# Patient Record
Sex: Male | Born: 1985 | Race: Black or African American | Hispanic: No | Marital: Single | Smoking: Current some day smoker
Health system: Southern US, Community
[De-identification: ages and names within clinical notes are randomized; demographics above are authoritative.]

## PROBLEM LIST (undated history)

## (undated) DIAGNOSIS — T7840XA Allergy, unspecified, initial encounter: Secondary | ICD-10-CM

## (undated) DIAGNOSIS — K219 Gastro-esophageal reflux disease without esophagitis: Secondary | ICD-10-CM

## (undated) DIAGNOSIS — I10 Essential (primary) hypertension: Secondary | ICD-10-CM

## (undated) HISTORY — DX: Allergy, unspecified, initial encounter: T78.40XA

## (undated) HISTORY — DX: Essential (primary) hypertension: I10

---
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
## 2007-04-22 ENCOUNTER — Ambulatory Visit: Payer: Self-pay | Admitting: Hospitalist

## 2007-04-22 DIAGNOSIS — I1A0 Resistant hypertension: Secondary | ICD-10-CM | POA: Insufficient documentation

## 2007-04-22 DIAGNOSIS — I10 Essential (primary) hypertension: Secondary | ICD-10-CM

## 2007-04-22 DIAGNOSIS — J309 Allergic rhinitis, unspecified: Secondary | ICD-10-CM | POA: Insufficient documentation

## 2007-04-29 ENCOUNTER — Emergency Department (HOSPITAL_COMMUNITY): Admission: EM | Admit: 2007-04-29 | Discharge: 2007-04-29 | Payer: Self-pay | Admitting: Emergency Medicine

## 2007-06-24 ENCOUNTER — Ambulatory Visit: Payer: Self-pay | Admitting: *Deleted

## 2007-11-08 ENCOUNTER — Telehealth (INDEPENDENT_AMBULATORY_CARE_PROVIDER_SITE_OTHER): Payer: Self-pay | Admitting: Internal Medicine

## 2007-11-13 ENCOUNTER — Encounter: Payer: Self-pay | Admitting: Internal Medicine

## 2007-11-18 ENCOUNTER — Encounter: Payer: Self-pay | Admitting: Internal Medicine

## 2008-01-08 ENCOUNTER — Telehealth: Payer: Self-pay | Admitting: Internal Medicine

## 2008-01-23 DIAGNOSIS — Z6841 Body Mass Index (BMI) 40.0 and over, adult: Secondary | ICD-10-CM

## 2008-03-04 ENCOUNTER — Encounter: Payer: Self-pay | Admitting: Internal Medicine

## 2008-03-05 ENCOUNTER — Encounter: Payer: Self-pay | Admitting: Internal Medicine

## 2008-03-05 ENCOUNTER — Ambulatory Visit: Payer: Self-pay | Admitting: *Deleted

## 2008-03-05 DIAGNOSIS — G4733 Obstructive sleep apnea (adult) (pediatric): Secondary | ICD-10-CM | POA: Insufficient documentation

## 2008-03-05 DIAGNOSIS — G4730 Sleep apnea, unspecified: Secondary | ICD-10-CM | POA: Insufficient documentation

## 2008-03-06 DIAGNOSIS — E559 Vitamin D deficiency, unspecified: Secondary | ICD-10-CM | POA: Insufficient documentation

## 2008-03-06 LAB — CONVERTED CEMR LAB
ALT: 57 units/L — ABNORMAL HIGH (ref 0–53)
AST: 28 units/L (ref 0–37)
Albumin: 4.4 g/dL (ref 3.5–5.2)
Alkaline Phosphatase: 48 units/L (ref 39–117)
BUN: 12 mg/dL (ref 6–23)
CO2: 23 meq/L (ref 19–32)
Calcium: 9.3 mg/dL (ref 8.4–10.5)
Chloride: 106 meq/L (ref 96–112)
Cholesterol: 156 mg/dL (ref 0–200)
Creatinine, Ser: 0.85 mg/dL (ref 0.40–1.50)
Glucose, Bld: 90 mg/dL (ref 70–99)
HDL: 41 mg/dL (ref >39)
LDL Cholesterol: 102 mg/dL — ABNORMAL HIGH (ref 0–99)
Potassium: 3.9 meq/L (ref 3.5–5.3)
Sodium: 141 meq/L (ref 135–145)
TSH: 1.776 microintl units/mL (ref 0.350–4.50)
Total Bilirubin: 0.6 mg/dL (ref 0.3–1.2)
Total CHOL/HDL Ratio: 3.8
Total Protein: 7 g/dL (ref 6.0–8.3)
Triglycerides: 63 mg/dL (ref <150)
VLDL: 13 mg/dL (ref 0–40)
Vit D, 25-Hydroxy: 17 ng/mL — ABNORMAL LOW (ref 30–89)

## 2008-05-29 ENCOUNTER — Encounter: Payer: Self-pay | Admitting: Internal Medicine

## 2008-05-29 DIAGNOSIS — M67919 Unspecified disorder of synovium and tendon, unspecified shoulder: Secondary | ICD-10-CM | POA: Insufficient documentation

## 2008-05-29 DIAGNOSIS — M719 Bursopathy, unspecified: Secondary | ICD-10-CM

## 2008-09-03 ENCOUNTER — Telehealth: Payer: Self-pay | Admitting: Internal Medicine

## 2008-12-16 ENCOUNTER — Ambulatory Visit: Payer: Self-pay | Admitting: Internal Medicine

## 2008-12-16 LAB — CONVERTED CEMR LAB
Basophils Absolute: 0 10*3/uL (ref 0.0–0.1)
Basophils Relative: 0 % (ref 0–1)
Eosinophils Absolute: 0.1 10*3/uL (ref 0.0–0.7)
Eosinophils Relative: 1 % (ref 0–5)
HCT: 42.2 % (ref 39.0–52.0)
Hemoglobin: 14.4 g/dL (ref 13.0–17.0)
Lymphocytes Relative: 31 % (ref 12–46)
Lymphs Abs: 3.9 10*3/uL (ref 0.7–4.0)
MCHC: 34.1 g/dL (ref 30.0–36.0)
MCV: 78.3 fL (ref >=78.0)
Monocytes Absolute: 1.1 10*3/uL — ABNORMAL HIGH (ref 0.1–1.0)
Monocytes Relative: 9 % (ref 3–12)
Neutro Abs: 7.4 10*3/uL (ref 1.7–7.7)
Neutrophils Relative %: 59 % (ref 43–77)
Platelets: 305 10*3/uL (ref 150–400)
RBC: 5.39 M/uL (ref 4.22–5.81)
RDW: 13.1 % (ref 11.5–15.5)
WBC: 12.5 10*3/uL — ABNORMAL HIGH (ref 4.0–10.5)

## 2009-03-01 ENCOUNTER — Telehealth: Payer: Self-pay | Admitting: Internal Medicine

## 2009-03-30 ENCOUNTER — Emergency Department (HOSPITAL_COMMUNITY): Admission: EM | Admit: 2009-03-30 | Discharge: 2009-03-30 | Payer: Self-pay | Admitting: Emergency Medicine

## 2009-03-31 ENCOUNTER — Emergency Department (HOSPITAL_COMMUNITY): Admission: EM | Admit: 2009-03-31 | Discharge: 2009-04-01 | Payer: Self-pay | Admitting: Emergency Medicine

## 2009-04-01 ENCOUNTER — Inpatient Hospital Stay (HOSPITAL_COMMUNITY): Admission: AD | Admit: 2009-04-01 | Discharge: 2009-04-02 | Payer: Self-pay | Admitting: Psychiatry

## 2009-04-01 ENCOUNTER — Ambulatory Visit: Payer: Self-pay | Admitting: Psychiatry

## 2009-04-08 ENCOUNTER — Ambulatory Visit: Payer: Self-pay | Admitting: Internal Medicine

## 2009-04-08 DIAGNOSIS — F988 Other specified behavioral and emotional disorders with onset usually occurring in childhood and adolescence: Secondary | ICD-10-CM

## 2009-04-08 DIAGNOSIS — F1999 Other psychoactive substance use, unspecified with unspecified psychoactive substance-induced disorder: Secondary | ICD-10-CM | POA: Insufficient documentation

## 2009-04-11 ENCOUNTER — Emergency Department (HOSPITAL_COMMUNITY): Admission: EM | Admit: 2009-04-11 | Discharge: 2009-04-11 | Payer: Self-pay | Admitting: Family Medicine

## 2009-05-05 ENCOUNTER — Telehealth: Payer: Self-pay | Admitting: Internal Medicine

## 2009-05-10 ENCOUNTER — Ambulatory Visit (HOSPITAL_COMMUNITY): Admission: RE | Admit: 2009-05-10 | Discharge: 2009-05-10 | Payer: Self-pay | Admitting: Internal Medicine

## 2009-05-10 ENCOUNTER — Ambulatory Visit: Payer: Self-pay | Admitting: Internal Medicine

## 2009-05-10 ENCOUNTER — Encounter: Payer: Self-pay | Admitting: Internal Medicine

## 2009-05-10 ENCOUNTER — Telehealth: Payer: Self-pay | Admitting: Internal Medicine

## 2009-05-10 DIAGNOSIS — M25562 Pain in left knee: Secondary | ICD-10-CM | POA: Insufficient documentation

## 2009-05-10 DIAGNOSIS — F418 Other specified anxiety disorders: Secondary | ICD-10-CM | POA: Insufficient documentation

## 2009-05-10 DIAGNOSIS — R300 Dysuria: Secondary | ICD-10-CM | POA: Insufficient documentation

## 2009-05-10 DIAGNOSIS — M25561 Pain in right knee: Secondary | ICD-10-CM | POA: Insufficient documentation

## 2009-05-10 LAB — CONVERTED CEMR LAB
Albumin: 4.6 g/dL (ref 3.5–5.2)
Alkaline Phosphatase: 42 units/L (ref 39–117)
BUN: 8 mg/dL (ref 6–23)
Barbiturate Quant, Ur: NEGATIVE
Blood in Urine, dipstick: NEGATIVE
CO2: 23 meq/L (ref 19–32)
Chlamydia, Swab/Urine, PCR: NEGATIVE
Cocaine Metabolites: NEGATIVE
Creatinine,U: 113.8 mg/dL
GC Probe Amp, Urine: NEGATIVE
Glucose, Bld: 100 mg/dL — ABNORMAL HIGH (ref 70–99)
Glucose, Urine, Semiquant: NEGATIVE
Hemoglobin, Urine: NEGATIVE
Hemoglobin: 14 g/dL (ref 13.0–17.0)
Ketones, ur: NEGATIVE mg/dL
Ketones, urine, test strip: NEGATIVE
Leukocytes, UA: NEGATIVE
MCHC: 31.7 g/dL (ref 30.0–36.0)
MCV: 83.4 fL (ref >=78.0)
Methadone: NEGATIVE
Nitrite: NEGATIVE
Nitrite: NEGATIVE
Opiates: NEGATIVE
Potassium: 4.1 meq/L (ref 3.5–5.3)
Propoxyphene: NEGATIVE
Protein, ur: NEGATIVE mg/dL
RBC: 5.29 M/uL (ref 4.22–5.81)
Sodium: 137 meq/L (ref 135–145)
Total Protein: 7 g/dL (ref 6.0–8.3)
Urobilinogen, UA: 0.2
Urobilinogen, UA: 0.2 (ref 0.0–1.0)
WBC: 8.9 10*3/uL (ref 4.0–10.5)
pH: 5

## 2009-05-11 ENCOUNTER — Encounter: Payer: Self-pay | Admitting: Internal Medicine

## 2009-05-11 ENCOUNTER — Telehealth: Payer: Self-pay | Admitting: Internal Medicine

## 2009-06-15 ENCOUNTER — Encounter: Payer: Self-pay | Admitting: Internal Medicine

## 2009-07-02 ENCOUNTER — Encounter: Payer: Self-pay | Admitting: Internal Medicine

## 2009-07-05 ENCOUNTER — Encounter: Payer: Self-pay | Admitting: Internal Medicine

## 2009-07-05 ENCOUNTER — Ambulatory Visit: Payer: Self-pay | Admitting: Internal Medicine

## 2009-07-06 LAB — CONVERTED CEMR LAB
ALT: 54 units/L — ABNORMAL HIGH (ref 0–53)
Albumin: 4.4 g/dL (ref 3.5–5.2)
Alkaline Phosphatase: 42 units/L (ref 39–117)
Basophils Relative: 0 % (ref 0–1)
CO2: 23 meq/L (ref 19–32)
Creatinine, Ser: 1.02 mg/dL (ref 0.40–1.50)
Eosinophils Absolute: 0 10*3/uL (ref 0.0–0.7)
Eosinophils Relative: 1 % (ref 0–5)
HCT: 44.2 % (ref 39.0–52.0)
HDL: 48 mg/dL (ref >39)
Hemoglobin: 14.7 g/dL (ref 13.0–17.0)
LDL Cholesterol: 121 mg/dL — ABNORMAL HIGH (ref 0–99)
Lymphs Abs: 3.3 10*3/uL (ref 0.7–4.0)
MCHC: 33.3 g/dL (ref 30.0–36.0)
MCV: 80.4 fL (ref >=78.0)
Monocytes Absolute: 0.6 10*3/uL (ref 0.1–1.0)
Monocytes Relative: 8 % (ref 3–12)
Neutrophils Relative %: 48 % (ref 43–77)
Platelets: 287 10*3/uL (ref 150–400)
Sodium: 140 meq/L (ref 135–145)
Total Bilirubin: 0.5 mg/dL (ref 0.3–1.2)
Total CHOL/HDL Ratio: 3.8
Total Protein: 6.8 g/dL (ref 6.0–8.3)
WBC: 7.8 10*3/uL (ref 4.0–10.5)

## 2009-11-17 ENCOUNTER — Encounter: Payer: Self-pay | Admitting: Internal Medicine

## 2009-12-08 ENCOUNTER — Ambulatory Visit (HOSPITAL_COMMUNITY): Admission: RE | Admit: 2009-12-08 | Discharge: 2009-12-08 | Payer: Self-pay | Admitting: Internal Medicine

## 2009-12-08 ENCOUNTER — Ambulatory Visit: Payer: Self-pay | Admitting: Internal Medicine

## 2010-02-15 NOTE — Progress Notes (Signed)
Summary: Urinating  Phone Note Call from Patient   Caller: Mom Call For: Julaine Fusi  DO Summary of Call: Call from pt's mom spoke to telecommunicator.  Said that pt is having problems urinating. Pt was given an appointment for this am.Gladys Herbin RN  May 10, 2009 9:31 AM  Initial call taken by: Angelina Ok RN,  May 10, 2009 9:31 AM  Follow-up for Phone Call        spoke with patient- hx of epididymitis untreated- was suppose to have taken Doxy (seen at Urgent care). Will put in for UA, urine GC probe, and blood work- he was recentkly started on Abilify and Lamictal by psychiatrist. To be seen in clinic today. Follow-up by: Julaine Fusi  DO,  May 10, 2009 9:55 AM  New Problems: DYSURIA (ICD-788.1) DEPRESSION/ANXIETY (ICD-300.4)   New Problems: DYSURIA (ICD-788.1) DEPRESSION/ANXIETY (ICD-300.4)  Process Orders Check Orders Results:     [facility]: ABN not required for this insurance Tests Sent for requisitioning (May 10, 2009 1:41 PM):     05/10/2009: [facility] -- T-Chlamydia & GC Probe, Urine [[id number]] (signed)     05/10/2009: [facility] -- T-Drug Screen-Urine, (single) [[id number]] (signed)

## 2010-02-15 NOTE — Progress Notes (Signed)
  Phone Note Call from Patient   Caller: Mom Call For: Dakota Fusi  DO Reason for Call: Lab or Test Results Details for Reason: Orders Summary of Call: PLease remember to put orders in for this patient.  Labs orders were faxed from Dr. Tresa Endo Virgil's office and have been placed in your box. After labs are drawn and reviewed please fax them to [phone].  Per Patient's request. Thanks!! Initial call taken by: Shon Hough,  May 05, 2009 2:55 PM  Follow-up for Phone Call        done Follow-up by: Dakota Fusi  DO,  May 16, 2009 4:31 PM

## 2010-02-15 NOTE — Assessment & Plan Note (Signed)
Summary: [name]   Vital Signs:  Patient profile:   25 year old male Height:      73.5 inches (186.69 cm) Weight:      321.7 pounds (146.23 kg) BMI:     42.02 Temp:     98.5 degrees F (36.94 degrees C) oral Pulse rate:   87 / minute BP sitting:   150 / 91  (right arm)  Vitals Entered By: Dakota Kidney Ditzler RN (May 10, 2009 10:09 AM) Is Patient Diabetic? No Pain Assessment Patient in pain? no      Nutritional Status BMI of > 30 = obese Nutritional Status Detail appetite good  Have you ever been in a relationship where you felt threatened, hurt or afraid?denies   Does patient need assistance? Functional Status Self care Ambulation Normal Comments Mother with pt. Discuss side effects from new drugs from [facility]. Needs labs. Problems with urination and BM. Fell on left knee about 1 month ago - sore, tender and popping sounds.   Primary Care Provider:  Julaine Fusi  DO   History of Present Illness: 30 you pleasant male with PMH outlined below presents to clinic for regular follow up concerned with side effects of the meds he is on. He tells me that he thinks that Lamictal andAbilify are causing him to have restlessness and iritability, he is unable to get comfortable and his personal thinking is that the meds are not helping him and he would like to start thinking about slowly tapering it off. Overall he also tells me that he thinks there is a significant improvement in his sleeping habits and depression and he is able to focus more. He gets about 5 hours of sleep at night and has not had any nightmares or night terrors.  He reports left knee pain, he fell one week ago and still has intermittent knee pain. Improving but still present.   Depression History:      The patient denies a depressed mood most of the day and a diminished interest in his usual daily activities.  Positive alarm features for depression include insomnia and impaired  concentration (indecisiveness).  However, he denies significant weight loss, significant weight gain, hypersomnia, psychomotor agitation, psychomotor retardation, fatigue (loss of energy), feelings of worthlessness (guilt), and recurrent thoughts of death or suicide.        The patient denies that he feels like life is not worth living, denies that he wishes that he were dead, and denies that he has thought about ending his life.         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 Take 1 Tablet By Mouth Once A Day 2)  Drisdol 16109 Unit Caps (Ergocalciferol) .... Take One Tablet By Mouth Once Weekly or As Directed. 3)  Ibuprofen  600 Mg Tabs (Ibuprofen) .... Take 1 Tablet By Mouth Three Times A Day As Needed For Pain 4)  Nexium 20 Mg Cpdr (Esomeprazole Magnesium) .... Take 1 Tablet By Mouth Once A Day 5)  Avelox 400 Mg Tabs (Moxifloxacin Hcl) .... Take 1 Tablet By Mouth Daily. 6)  Proair Hfa 108 (90 Base) Mcg/act Aers (Albuterol Sulfate) .... Inhale 2 Puff Every 6 Hour As Needed. 7)  Flonase 50 Mcg/act Susp (Fluticasone Propionate) .... Instill 2 Sprays Per Nostril Once Daily. 8)  Guaifenesin 200 Mg Tabs (Guaifenesin) .... Take 1 Tablet Every 6 Hour For Cough As Needed. 9)  Vyvanse 40 Mg Caps (Lisdexamfetamine Dimesylate) ....  Take 1 Tablet By Mouth Once A Day 10)  Trazodone Hcl 150 Mg Tabs (Trazodone Hcl) .... Take 1 Tablet By Mouth Once A Day At Bedtime  Current Medications (verified): 1)  Benicar 40 Mg  Tabs (Olmesartan Medoxomil) .... Take 1 Tablet By Mouth Once A Day 2)  Drisdol 66440 Unit Caps (Ergocalciferol) .... Take One Tablet By Mouth Once Weekly or As Directed. 3)  Ibuprofen 600 Mg Tabs (Ibuprofen) .... Take 1 Tablet By Mouth Three Times A Day As Needed For Pain 4)  Nexium 20 Mg Cpdr (Esomeprazole Magnesium) .... Take 1 Tablet By Mouth Once A Day 5)  Avelox 400 Mg Tabs (Moxifloxacin Hcl) .... Take 1 Tablet By Mouth Daily. 6)  Proair Hfa 108 (90 Base) Mcg/act Aers (Albuterol Sulfate) .... Inhale 2 Puff Every 6 Hour As Needed. 7)  Flonase 50 Mcg/act Susp (Fluticasone Propionate) .... Instill 2 Sprays Per Nostril Once Daily. 8)  Guaifenesin 200 Mg Tabs (Guaifenesin) .... Take 1 Tablet Every 6 Hour For Cough As Needed. 9)  Vyvanse 40 Mg Caps (Lisdexamfetamine Dimesylate) .... Take 1 Tablet By Mouth Once A Day 10)  Trazodone Hcl 150 Mg Tabs (Trazodone Hcl) .... Take 1 Tablet By Mouth Once A Day At Bedtime  Allergies (verified): No Known Drug Allergies  Past History:  Past Medical History: Last updated: 04/22/2007 Allergic rhinitis Hypertension  Social History: Last updated: 04/08/2009 [facility] [employer] No smoking Occasional ETOH  Risk Factors: Exercise: yes (05/10/2009)  Risk Factors: Smoking Status: quit (05/10/2009)  Social History: Reviewed history from 04/08/2009 and no changes required. [employer] Student Voice Major No smoking Occasional ETOH  Review of Systems       per HPI  Physical Exam  General:  Well-developed,well-nourished,in no acute distress; alert,appropriate and cooperative throughout examination Lungs:  Normal respiratory effort, chest expands symmetrically. Lungs are clear to auscultation, no crackles or wheezes. Heart:  Normal rate  and regular rhythm. S1 and S2 normal without gallop, murmur, click, rub or other extra sounds. Psych:  Oriented X3, memory intact for recent and remote, normally interactive, good eye contact, not anxious appearing, not agitated, not suicidal, and not homicidal.     Impression & Recommendations:  Problem # 1:  HYPERTENSION, ESSENTIAL NOS (ICD-401.9) Somewhat higher today but will not change the regimen at this time. Will continue to monitor closely and will readjust the regimen as indicated.  His updated medication list for this problem includes:    Benicar 40 Mg Tabs (Olmesartan medoxomil) .Marland Kitchen... Take 1 tablet by mouth once a day  BP today: 150/91 Prior BP: 139/84 (04/08/2009)  Labs Reviewed: K+: 3.9 (03/05/2008) Creat: : 0.85 (03/05/2008)   Chol: 156 (03/05/2008)   HDL: 41 (03/05/2008)   LDL: 102 (03/05/2008)   TG: 63 (03/05/2008)  Problem # 2:  KNEE PAIN, LEFT, ACUTE (ICD-719.46) Likely  trauma induced (from fall on his patella), and since it is in healing process will check xray amd kae sure no acute pathologies, otherwise will cont to follow up and will give ibuprofen as needed.  His updated medication list for this problem includes:    Ibuprofen 600 Mg Tabs (Ibuprofen) .Marland Kitchen... 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 What he is describing could be side effect of both of the meds but will have him follow up with [facility] for medication adjustment. My personal thinking is that he needs to start exercising and work on the diet changes regimen. I have discussed this with him in detail and have given him 30 day pass at the gym. He has agreed to make this a daily habit for 30 min to 1 hour and he ahs agreed with me that he will start reading any material at least 2 pages per day and  will work up from there. He is confident that he can do these things and will follow up on this on his next appointment.  Orders: T-Comprehensive Metabolic Panel ([id number])  Problem # 4:  DYSURIA (ICD-788.1) Will check UA today and advised him to complete Doxycycline treatment for epididymitis. The following medications were removed from the medication list:    Avelox 400 Mg Tabs (Moxifloxacin hcl) .Marland Kitchen... Take 1 tablet by mouth daily.  Orders: T-Urinalysis ([id number])  Complete Medication List: 1)  Benicar 40 Mg Tabs (Olmesartan medoxomil) .... Take 1 tablet by mouth once a day 2)  Drisdol 29562 Unit Caps (Ergocalciferol) .... Take one tablet by mouth once weekly or as directed. 3)  Ibuprofen 600 Mg Tabs (Ibuprofen) .... Take 1 tablet by mouth three times a day as needed for pain 4)  Nexium 20 Mg Cpdr (Esomeprazole magnesium) .... Take 1 tablet by mouth once a day 5)  Proair Hfa 108 (90 Base) Mcg/act Aers (Albuterol sulfate) .... Inhale 2 puff every 6 hour as needed. 6)  Flonase 50 Mcg/act Susp (Fluticasone propionate) .... Instill 2 sprays per nostril once daily. 7)  Guaifenesin 200 Mg Tabs (Guaifenesin) .... Take 1 tablet every 6 hour for cough as needed. 8)  Vyvanse 40 Mg Caps (Lisdexamfetamine dimesylate) .... Take 1 tablet by mouth once a day 9)  Trazodone Hcl 150 Mg Tabs (Trazodone hcl) .... Take 1 tablet by mouth once a day at bedtime  Other Orders: T-CBC No Diff ([id number])  Patient Instructions: 1)  Please schedule a follow-up appointment in 3 months. 2)  Good job on your diet and exercise changes.  3)  Keep it up!!! 4)  Try to read before you go to sleep. Prescriptions: IBUPROFEN 600 MG TABS (IBUPROFEN) Take 1 tablet by mouth three times a day as needed for pain  #90 x 3   Entered and Authorized by:   Mliss Sax MD   Signed by:   Mliss Sax MD on 05/10/2009   Method used:   Electronically to        [facility] (retail)       [address]        [place], [place]  [address]       Ph: [phone]       Fax: [phone]   RxID:   [id number]  Process Orders Check Orders Results:     [facility]: ABN not required for this insurance Tests Sent for requisitioning (May 10, 2009 2:48 PM):     05/10/2009: [facility] -- T-Comprehensive Metabolic Panel [[id number]] (signed)     05/10/2009: [facility] -- T-CBC No Diff [[id number]] (signed)     05/10/2009: [facility] -- T-Urinalysis [[id number]] (signed)    Prevention & Chronic Care Immunizations   Influenza vaccine: Not documented   Influenza vaccine deferral: Not indicated  (05/10/2009)    Tetanus booster: Not documented   Td booster deferral: Not indicated  (05/10/2009)    Pneumococcal vaccine: Not documented  Other Screening   Smoking status: quit  (05/10/2009)  Hypertension   Last Blood Pressure: 150 / 91  (05/10/2009)   Serum creatinine: 0.85  (03/05/2008)   Serum potassium 3.9  (03/05/2008) CMP ordered     Hypertension flowsheet reviewed?: Yes   Progress toward BP goal: Deteriorated  Self-Management Support :   Personal Goals (by the next clinic visit) :      Personal blood pressure goal: 140/90  (04/08/2009)   Hypertension self-management support: Written self-care plan, Education handout, Resources for patients handout  (05/10/2009)   Hypertension self-care plan printed.   Hypertension education handout printed      Resource handout printed.   Laboratory Results   Urine Tests  Date/Time Received: 05/10/09 10:28AM Date/Time Reported: same  Routine Urinalysis   Color: yellow Appearance: Clear Glucose: negative   (Normal Range: Negative) Bilirubin: negative   (Normal Range: Negative) Ketone: negative   (Normal Range: Negative) Spec. Gravity: 1.015   (Normal Range: 1.003-1.035) Blood: negative   (Normal Range: Negative) pH: 5.0   (Normal Range:  5.0-8.0) Protein: negative   (Normal Range: Negative) Urobilinogen: 0.2   (Normal Range: 0-1) Nitrite: negative   (Normal Range: Negative) Leukocyte Esterace: negative   (Normal Range: Negative)

## 2010-02-15 NOTE — Miscellaneous (Signed)
Summary: Orders Update  Clinical Lists Changes  Problems: Added new problem of ENCOUNTER FOR LONG-TERM USE OF OTHER MEDICATIONS (ICD-V58.69) Orders: Added new Test order of T-Lipid Profile [id number]) - Signed Added new Test order of T-CBC w/Diff [id number]) - Signed Added new Test order of T-CMP with Estimated GFR ([id number]) - Signed     Process Orders Check Orders Results:     [facility]: ABN not required for this insurance Tests Sent for requisitioning (July 03, 2009 12:42 PM):     07/02/2009: [facility] -- T-Lipid Profile [id number] (signed)     07/02/2009: [facility] -- T-CBC w/Diff [[id number]] (signed)     07/02/2009: [facility] -- T-CMP with Estimated GFR [[id number]] (signed)

## 2010-02-15 NOTE — Assessment & Plan Note (Signed)
Summary: EKG ONLY NOT MD VISIT/DS   Vital Signs:  Patient profile:   25 year old male Height:      73.5 inches (186.69 cm) Weight:      355.7 pounds (161.68 kg) Temp:     97.6 degrees F oral Pulse rate:   57 / minute BP sitting:   145 / 89  (left arm) Cuff size:   large  Vitals Entered By: Cynda Familia Duncan Dull) (December 08, 2009 1:28 PM) CC: Pt here for EKG only to be faxed to [facility] atten: Saul Fordyce fax# [phone] Is Patient Diabetic? No Research Study Name: EKG was shown to Dr Coralee Pesa on Dr Lamar Blinks behalf.Cynda Familia [facility])  December 08, 2009 1:51 PM  Pain Assessment Patient in pain? no      Nutritional Status BMI of > 30 = obese  Have you ever been in a relationship where you felt threatened, hurt or afraid?No   Does patient need assistance? Functional Status Self care Ambulation Normal   CC:  Pt here for EKG only to be faxed to [facility] atten: Saul Fordyce fax# [phone].  Allergies: No Known Drug Allergies   Complete Medication List: 1)  Benicar 40 Mg Tabs (Olmesartan medoxomil) .... Take 1 tablet by mouth once a day 2)  Drisdol 60630 Unit Caps (Ergocalciferol) .... Take one tablet by mouth once weekly or as directed. 3)  Ibuprofen 600 Mg Tabs (Ibuprofen) .... Take 1 tablet by mouth three times a day as needed for pain 4)  Nexium 20 Mg Cpdr (Esomeprazole magnesium) .... Take 1 tablet by mouth once a day 5)  Proair Hfa 108 (90 Base) Mcg/act Aers (Albuterol sulfate) .... Inhale 2 puff every 6 hour as needed. 6)  Flonase 50 Mcg/act Susp (Fluticasone propionate) .... Instill 2 sprays per nostril once daily. 7)  Guaifenesin 200 Mg Tabs (Guaifenesin) .... Take 1 tablet every 6 hour for cough as needed. 8)  Vyvanse 40 Mg Caps (Lisdexamfetamine dimesylate) .... Take 1 tablet by mouth once a day 9)  Trazodone Hcl 150 Mg Tabs (Trazodone hcl) .... Take 1 tablet by mouth once a day at  bedtime  Other Orders: 12 Lead EKG (12 Lead EKG)   Orders Added: 1)  12 Lead EKG [12 Lead EKG]

## 2010-02-15 NOTE — Miscellaneous (Signed)
Summary: EKG order  Clinical Lists Changes Needs EKG done for Concerta prescribing. Will place order. Orders: Added new Test order of 12 Lead EKG (12 Lead EKG) - Signed

## 2010-02-15 NOTE — Progress Notes (Signed)
Summary: med refill/gp  Phone Note Refill Request Message from:  Fax from Pharmacy on March 01, 2009 3:37 PM  Refills Requested: Medication #1:  BENICAR 40 MG  TABS Take 1 tablet by mouth once a day   Last Refilled: 03/01/2009 Last appt. 12/16/08.   Method Requested: Electronic Initial call taken by: Chinita Pester RN,  March 01, 2009 3:37 PM  Follow-up for Phone Call        Refill approved-nurse to complete Follow-up by: Julaine Fusi  DO,  March 02, 2009 12:43 PM    Prescriptions: BENICAR 40 MG  TABS (OLMESARTAN MEDOXOMIL) Take 1 tablet by mouth once a day  #30 x 6   Entered and Authorized by:   Julaine Fusi  DO   Signed by:   Julaine Fusi  DO on 03/02/2009   Method used:   Electronically to        [facility] (retail)       [address]       [address] Shipping/mailing       [place], [place]  [address]       Ph: [phone]       Fax: [phone]   RxID:   [id number]

## 2010-02-15 NOTE — Assessment & Plan Note (Signed)
Summary: Dr. Phillips Odor to see pt. pls page her when pt. arrive [mkj]   Vital Signs:  Patient profile:   25 year old male Height:      73.5 inches (186.69 cm) Weight:      304.3 pounds (138.32 kg) BMI:     39.75 Temp:     98.9 degrees F (37.17 degrees C) oral Pulse rate:   77 / minute BP sitting:   139 / 84  (right arm) Cuff size:   large  Vitals Entered By: Krystal Eaton Duncan Dull) (April 08, 2009 11:00 AM) Nutritional Status BMI of > 30 = obese  Does patient need assistance? Functional Status Self care Ambulation Normal   Primary Care Provider:  Julaine Fusi  DO   History of Present Illness: Dakota Nicholson comes in today for evaluation after he was admitted to [facility] on 04/04/09 for an acute episode of brief psycosis following a binge on marajuana at college. Several hours after smoking a large amount of THC he began to hallucinate and become paranoid. He had a strong suspicion that it was "laced" with something unusual- possibly a synthetic THC called "Spice" that has been circulating throughout the college campuses in [place] according to local new channels. After using the drugs and ETOH. He called his mother and in a disoriented state "confessed all of his sins" to her. He told her that he had been using drugs and ETOH while in college on a regular basis. He has over the last semester had failing grades, has shown signs of depression, lack of interest in previously enjoyable activities and has lost a significant amonth of weight. He was d/c'd from [facility] without any medication other than Ambien for sleep. There is no d/c summary for review. Their recommendation was for counseling. No medications were started or given other than Ativan while he was in the ED.  Preventive Screening-Counseling & Management  Alcohol-Tobacco     Smoking Status: quit  Current Medications (verified): 1)  Benicar 40 Mg  Tabs (Olmesartan Medoxomil) .... Take 1 Tablet By Mouth Once A Day 2)  Drisdol 16109  Unit Caps (Ergocalciferol) .... Take One Tablet By Mouth Once Weekly or As Directed. 3)  Ibuprofen 600 Mg Tabs (Ibuprofen) .... Take 1 Tablet By Mouth Three Times A Day As Needed For Pain 4)  Nexium 20 Mg Cpdr (Esomeprazole Magnesium) .... Take 1 Tablet By Mouth Once A Day 5)  Avelox 400 Mg Tabs (Moxifloxacin Hcl) .... Take 1 Tablet By Mouth Daily. 6)  Proair Hfa 108 (90 Base) Mcg/act Aers (Albuterol Sulfate) .... Inhale 2 Puff Every 6 Hour As Needed. 7)  Flonase 50 Mcg/act Susp (Fluticasone Propionate) .... Instill 2 Sprays Per Nostril Once Daily. 8)  Guaifenesin 200 Mg Tabs (Guaifenesin) .... Take 1 Tablet Every 6 Hour For Cough As Needed. 9)  Vyvanse 40 Mg Caps (Lisdexamfetamine Dimesylate) .... Take 1 Tablet By Mouth Once A Day 10)  Trazodone Hcl 150 Mg Tabs (Trazodone Hcl) .... 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 Head:  normocephalic, atraumatic, and no abnormalities observed.   Eyes:  pupils equal, pupils round, and pupils reactive to light.   Neck:  supple.  Lungs:  normal respiratory effort, no intercostal retractions, no accessory muscle use, and normal breath sounds.   Heart:  normal rate, regular rhythm, and no murmur.   Abdomen:  soft, non-tender, normal bowel sounds, and no distention.   Neurologic:  alert & oriented X3 and strength normal in all extremities.   Psych:  Mild psychomotor delay. Long pauses before answering questions, but answers appropriately. Seems slightly paranoid. Some religiosity. No signs of mania. No AVH. No SI or HI. Looks apathetic and disinterested. Expresses deep feelings of guilt and remorse out of proportion to events.   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 underlying psychiatric pathology- I am assuming it is a combination of both. Dakota Nicholson is obviously depressed and there may be some element of depression with psychosis present. [facility] did not discharge him on any medication which is unusual if there was a serious underlying psychiatric problem. His presentation is very atypical for any one specific problem- I suspect he has "shut down" in the setting of all of the consequences of his poor decisions while at college. He has a very low self esteem in general.  He is failing his classes, he is fearful of what others will think of him at home and school, and he expresses extreme religious guilt and remore. I also think he is in a state of fear that he may get in trouble or be harmed from a legal standpoint, and is likely more involved with drugs and drug dealers than most of his providers and family realize.  I am hesitant to start him on an SSRI without the supervision and guidance of a psychiatrist. He has an appointment with Dr. Wynonia Lawman at [facility] next month. Dakota Nicholson is going to need extensive counseling and also family therapy. He has a very close family here in [place] and his parents are extremely involved in his day-to-day life and school. 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 I think it is very unusual that Dakota Nicholson does not drive a car at 25 years old. He does live in the dorms but has had close supervision and involvement of his parents  at [employer]. 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  Psychiatry appt. Counseling Appt scheduel for today/ Start Trazadone for sleep.  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 It is quite possible that the drug use, failing grades and anxiety/depression are a result of untreated ADHD. I will start him on treatment today for 1 month as a trial to see if his focus and energy level improve.  Complete Medication List: 1)  Benicar 40 Mg Tabs (Olmesartan medoxomil) .... Take 1 tablet by mouth once a day 2)  Drisdol 04540 Unit Caps (Ergocalciferol) .... Take one tablet by mouth once weekly or as directed. 3)  Ibuprofen 600 Mg Tabs (Ibuprofen) .... Take 1 tablet by mouth three times a day as needed for pain 4)  Nexium 20 Mg Cpdr (Esomeprazole magnesium) .... Take 1 tablet by mouth once a day 5)  Avelox 400 Mg Tabs (Moxifloxacin hcl) .... Take 1 tablet by mouth daily. 6)  Proair Hfa 108 (90 Base) Mcg/act Aers (Albuterol sulfate) .... Inhale 2  puff every 6 hour as needed. 7)  Flonase 50 Mcg/act Susp (Fluticasone propionate) .... Instill 2 sprays per nostril once daily. 8)  Guaifenesin 200 Mg Tabs (Guaifenesin) .... Take 1 tablet every 6 hour for cough as needed. 9)  Vyvanse 40 Mg Caps (Lisdexamfetamine dimesylate) .... Take 1 tablet by mouth once a day 10)  Trazodone Hcl 150 Mg Tabs (Trazodone hcl) .... Take 1 tablet by mouth once a day at bedtime  Patient Instructions: 1)  Will start Vyvanse every AM. 2)  Start Trazadone one tab by mouth at bedtime. 3)  Otherwise continue with plan to see therapist and psychiatry.  Prescriptions: VYVANSE 40 MG CAPS (LISDEXAMFETAMINE DIMESYLATE) Take 1 tablet by mouth once a day  #31 x 0   Entered and Authorized by:   Julaine Fusi  DO   Signed by:   Julaine Fusi  DO on 04/08/2009   Method used:   Print then Give to Patient   RxID:   [id number] TRAZODONE HCL 150 MG TABS (TRAZODONE HCL) Take 1 tablet by mouth once a day at bedtime  #31 x 0   Entered and Authorized by:   Julaine Fusi  DO   Signed by:   Julaine Fusi  DO on 04/08/2009   Method used:   Print then Give to Patient    RxID:   [id number] VYVANSE 40 MG CAPS (LISDEXAMFETAMINE DIMESYLATE) Take 1 tablet by mouth once a day  #60 x 0   Entered and Authorized by:   Julaine Fusi  DO   Signed by:   Julaine Fusi  DO on 04/08/2009   Method used:   Print then Give to Patient   RxID:   [id number]   Prevention & Chronic Care Immunizations   Influenza vaccine: Not documented    Tetanus booster: Not documented    Pneumococcal vaccine: Not documented  Other Screening   Smoking status: quit  (04/08/2009)  Hypertension   Last Blood Pressure: 139 / 84  (04/08/2009)   Serum creatinine: 0.85  (03/05/2008)   Serum potassium 3.9  (03/05/2008)  Self-Management Support :   Personal Goals (by the next clinic visit) :      Personal blood pressure goal: 140/90  (04/08/2009)   Patient will work on the following items until the next clinic visit to reach self-care goals:     Medications and monitoring: take my medicines every day  (04/08/2009)     Eating: eat more vegetables, eat foods that are low in salt, eat baked foods instead of fried foods  (04/08/2009)     Activity: take a 30 minute walk every day  (04/08/2009)    Hypertension self-management support: Pre-printed educational material, Education handout, Written self-care plan  (04/08/2009)   Hypertension self-care plan printed.   Hypertension education handout printed     Patient Instructions: 1)  Will start Vyvanse every AM. 2)  Start Trazadone one tab by mouth at bedtime. 3)  Otherwise continue with plan to see therapist and psychiatry.

## 2010-02-15 NOTE — Progress Notes (Signed)
Summary: refill/gg  Phone Note Refill Request  on May 11, 2009 9:41 AM  Refills Requested: Medication #1:  NEXIUM 20 MG CPDR Take 1 tablet by mouth once a day   Last Refilled: 03/22/2009  Method Requested: Electronic Initial call taken by: Merrie Roof RN,  May 11, 2009 9:42 AM  Follow-up for Phone Call        completed refill, thank you Breon Diss  Follow-up by: Mliss Sax MD,  May 11, 2009 11:02 AM    Prescriptions: NEXIUM 20 MG CPDR (ESOMEPRAZOLE MAGNESIUM) Take 1 tablet by mouth once a day  #30 x 3   Entered by:   Mliss Sax MD   Authorized by:   Julaine Fusi  DO   Signed by:   Mliss Sax MD on 05/11/2009   Method used:   Electronically to        [facility]* (retail)       [address]       [address] Shipping/mailing       [place], [place]  [address]       Ph: [phone]       Fax: [phone]   RxID:   [id number]

## 2010-02-15 NOTE — Miscellaneous (Signed)
Summary: [facility]   Imported By: Margie Billet 05/13/2009 11:24:42  _____________________________________________________________________  External Attachment:    Type:   Image     Comment:   External Document

## 2010-04-10 LAB — POCT I-STAT, CHEM 8
Calcium, Ion: 1.16 mmol/L (ref 1.12–1.32)
Glucose, Bld: 111 mg/dL — ABNORMAL HIGH (ref 70–99)
HCT: 45 % (ref 39.0–52.0)
Hemoglobin: 15.3 g/dL (ref 13.0–17.0)
Potassium: 3.8 mEq/L (ref 3.5–5.1)

## 2010-04-10 LAB — CBC
HCT: 43.2 % (ref 39.0–52.0)
Hemoglobin: 14.3 g/dL (ref 13.0–17.0)
MCV: 81.3 fL (ref 78.0–100.0)
Platelets: 273 10*3/uL (ref 150–400)
RBC: 5.32 MIL/uL (ref 4.22–5.81)
WBC: 14 10*3/uL — ABNORMAL HIGH (ref 4.0–10.5)

## 2010-04-10 LAB — ETHANOL
Alcohol, Ethyl (B): <5 mg/dL (ref 0–10)
Alcohol, Ethyl (B): <5 mg/dL (ref 0–10)

## 2010-04-10 LAB — COMPREHENSIVE METABOLIC PANEL
Albumin: 4.3 g/dL (ref 3.5–5.2)
Alkaline Phosphatase: 48 U/L (ref 39–117)
BUN: 7 mg/dL (ref 6–23)
CO2: 24 mEq/L (ref 19–32)
Chloride: 105 mEq/L (ref 96–112)
Creatinine, Ser: 0.86 mg/dL (ref 0.4–1.5)
GFR calc non Af Amer: >60 mL/min (ref >60)
Glucose, Bld: 88 mg/dL (ref 70–99)
Potassium: 4 mEq/L (ref 3.5–5.1)
Total Bilirubin: 0.4 mg/dL (ref 0.3–1.2)

## 2010-04-10 LAB — POCT URINALYSIS DIP (DEVICE)
Bilirubin Urine: NEGATIVE
Glucose, UA: NEGATIVE mg/dL
Hgb urine dipstick: NEGATIVE
Specific Gravity, Urine: >=1.03 (ref 1.005–1.030)
pH: 5.5 (ref 5.0–8.0)

## 2010-04-10 LAB — DIFFERENTIAL
Basophils Absolute: 0.1 10*3/uL (ref 0.0–0.1)
Basophils Relative: 1 % (ref 0–1)
Monocytes Absolute: 1.3 10*3/uL — ABNORMAL HIGH (ref 0.1–1.0)
Neutro Abs: 10.5 10*3/uL — ABNORMAL HIGH (ref 1.7–7.7)

## 2010-04-10 LAB — GLUCOSE, CAPILLARY: Glucose-Capillary: 198 mg/dL — ABNORMAL HIGH (ref 70–99)

## 2010-04-10 LAB — RAPID URINE DRUG SCREEN, HOSP PERFORMED: Barbiturates: NOT DETECTED

## 2010-06-02 ENCOUNTER — Other Ambulatory Visit: Payer: Self-pay | Admitting: *Deleted

## 2010-06-02 MED ORDER — ESOMEPRAZOLE MAGNESIUM 20 MG PO CPDR: 20.0000 mg | DELAYED_RELEASE_CAPSULE | Freq: Every day | ORAL | Status: DC

## 2010-06-02 MED ORDER — OLMESARTAN MEDOXOMIL 40 MG PO TABS: 40.0000 mg | ORAL_TABLET | Freq: Every day | ORAL | Status: DC

## 2010-06-14 ENCOUNTER — Encounter: Payer: Self-pay | Admitting: Internal Medicine

## 2010-08-22 ENCOUNTER — Ambulatory Visit (INDEPENDENT_AMBULATORY_CARE_PROVIDER_SITE_OTHER): Payer: Self-pay | Admitting: Internal Medicine

## 2010-08-22 ENCOUNTER — Encounter: Payer: Self-pay | Admitting: Internal Medicine

## 2010-08-22 VITALS — BP 140/85 | HR 69 | Temp 98.6°F | Ht 74.0 in | Wt 360.2 lb

## 2010-08-22 DIAGNOSIS — K219 Gastro-esophageal reflux disease without esophagitis: Secondary | ICD-10-CM

## 2010-08-22 DIAGNOSIS — I10 Essential (primary) hypertension: Secondary | ICD-10-CM

## 2010-08-22 DIAGNOSIS — Z0000 Encounter for general adult medical examination without abnormal findings: Secondary | ICD-10-CM | POA: Insufficient documentation

## 2010-08-22 DIAGNOSIS — G4730 Sleep apnea, unspecified: Secondary | ICD-10-CM

## 2010-08-22 DIAGNOSIS — R5383 Other fatigue: Secondary | ICD-10-CM | POA: Insufficient documentation

## 2010-08-22 DIAGNOSIS — E559 Vitamin D deficiency, unspecified: Secondary | ICD-10-CM

## 2010-08-22 LAB — CBC
HCT: 43.6 % (ref 39.0–52.0)
Hemoglobin: 14.2 g/dL (ref 13.0–17.0)
MCHC: 32.6 g/dL (ref 30.0–36.0)
MCV: 81.3 fL (ref 78.0–100.0)
RBC: 5.36 MIL/uL (ref 4.22–5.81)
WBC: 9.3 10*3/uL (ref 4.0–10.5)

## 2010-08-22 LAB — BASIC METABOLIC PANEL
BUN: 6 mg/dL (ref 6–23)
CO2: 24 mEq/L (ref 19–32)
Calcium: 9 mg/dL (ref 8.4–10.5)
Chloride: 105 mEq/L (ref 96–112)
Creat: 0.75 mg/dL (ref 0.50–1.35)
Glucose, Bld: 111 mg/dL — ABNORMAL HIGH (ref 70–99)

## 2010-08-22 LAB — LIPID PANEL
HDL: 46 mg/dL (ref >39)
LDL Cholesterol: 89 mg/dL (ref 0–99)

## 2010-08-22 MED ORDER — ESOMEPRAZOLE MAGNESIUM 40 MG PO CPDR: 40.0000 mg | DELAYED_RELEASE_CAPSULE | Freq: Every day | ORAL | Status: DC

## 2010-08-22 MED ORDER — ALBUTEROL SULFATE HFA 108 (90 BASE) MCG/ACT IN AERS: 2.0000 | INHALATION_SPRAY | Freq: Four times a day (QID) | RESPIRATORY_TRACT | Status: DC | PRN

## 2010-08-22 NOTE — Patient Instructions (Addendum)
   Please follow-up at the clinic in 6 months with your PCP, at which time we will reevaluate your blood pressure, fatigue, weight loss.  Please try to wear your CPAP at night daily to help prevent future complications and help with your overall energy level.  Please follow diet below for your reflux.  Please continue your efforts towards weight loss.  If symptoms worsen, or new symptoms arise, please call the clinic or go to the ER.  Please bring all of your medications in a bag to your next visit.   Diet for GERD or PUD Nutrition therapy can help ease the discomfort of gastroesophageal reflux disease (GERD) and peptic ulcer disease (PUD).   HOME CARE INSTRUCTIONS  Eat your meals slowly, in a relaxed setting.   Eat 5 to 6 small meals per day.   If a food causes distress, stop eating it for a period of time.  FOODS TO AVOID:  Coffee, regular or decaffeinated.   Cola beverages, regular or low calorie.     Tea, regular or decaffeinated.     Pepper.    Cocoa.    High fat foods including meats.     Butter, margarine, hydrogenated oil (trans fats).   Peppermint or spearmint (if you have GERD).     Fruits and vegetables as tolerated.     Alcoholic beverages.     Nicotine (smoking or chewing). This is one of the most potent stimulants to acid production in the gastrointestinal tract.     Any food that seems to aggravate your condition.     If you have questions regarding your diet, call your caregiver's office or a registered dietitian. OTHER TIPS IF YOU HAVE GERD:  Lying flat may make symptoms worse. Keep the head of your bed raised 6 to 9 inches by using a foam wedge or blocks under the legs of the bed.   Do not lay down until 3 hours after eating a meal.   Daily physical activity may help reduce symptoms.  MAKE SURE YOU:    Understand these instructions.   Will watch your condition.   Will get help right away if you are not doing well or get worse.  Document  Released: 01/02/2005 Document Re-Released: 05/21/2008 [facility] Patient Information 2011 [place], [place].

## 2010-08-22 NOTE — Progress Notes (Signed)
  Subjective:    Patient ID: Dakota Nicholson, male    DOB: July 25, 1985, 25 y.o.   MRN: [id number]  HPI Pt is a 25 y.o. male who  has a past medical history of Hypertension and presents to clinic today for the following:  1) HTN - Patient does not check blood pressure regularly at home. Currently taking Benicar 40mg  daily. denies headaches, dizziness, lightheadedness, chest pain, shortness of breath.  Does request refills today.  2) GERD - Feels that his GERD is uncontrolled on his current Nexium 20mg , continues to have brash sensation in his throat, occuring about every 3 days, feels it is typical of his GERD symptoms. No dysphagia to fluids or solids.  He has been experiencing heartburn, that is worse with spicy foods for 6 month(s), intermittent over 10 minutes at a time. ROS: patient denies abdominal pain, cough, weight loss, dysphagia, hematemesis or history of PUD, has to clear his throat frequently. Social history: alcohol intake is infrequent every 3 months.  3) Vitamin D deficiency - was diagnosed with vitamin D in 2010 with a level of 17, was put on high dose vitamin D, which he continued for a few months, then did not continue with the supplementation after needing refills. Confirms feelings of fatigue.     Review of Systems Per HPI.  Current Outpatient Medications Medication Sig  . esomeprazole (NEXIUM) 20 MG packet Take 20 mg by mouth daily before breakfast.    . ibuprofen (ADVIL,MOTRIN) 600 MG tablet Take 600 mg by mouth 3 (three) times daily as needed.    Marland Kitchen olmesartan (BENICAR) 40 MG tablet Take 40 mg by mouth daily.    Marland Kitchen albuterol (PROAIR HFA) 108 (90 BASE) MCG/ACT inhaler Inhale 2 puffs into the lungs every 6 (six) hours as needed.      Allergies Review of patient's allergies indicates no known allergies.  Past Medical History  Diagnosis Date  . Allergy   . Hypertension        Objective:   Physical Exam General: Vital signs reviewed and noted. Well-developed,  well-nourished, in no acute distress; alert, appropriate and cooperative throughout examination.  Head: Normocephalic, atraumatic.  Ears: TM nonerythematous, not bulging, good light reflex bilaterally.  Nose: Mucous membranes moist, not inflammed, nonerythematous.  Throat: Oropharynx nonerythematous, no exudate appreciated.   Neck: No deformities, masses, or tenderness noted.  Lungs:  Normal respiratory effort. Clear to auscultation BL without crackles or wheezes.  Heart: RRR. S1 and S2 normal without gallop, murmur, or rubs.  Abdomen:  BS normoactive. Soft, Nondistended, non-tender.  No masses or organomegaly.  Extremities: No pretibial edema.        Assessment & Plan:  Case and plan of care discussed with Dr. Blanch Media.

## 2010-08-22 NOTE — Assessment & Plan Note (Addendum)
The pathophysiology of reflux is discussed.  Anti-reflux measures such as raising the head of the bed, avoiding tight clothing or belts, avoiding eating late at night and not lying down shortly after mealtime and achieving weight loss are discussed. Avoid ASA, NSAID's, caffeine, peppermints, alcohol and tobacco.  - Rx for PPI is written, Nexium dose was increased.  - He should alert me if there are persistent symptoms, dysphagia, weight loss or GI bleeding.  - Handout given regarding GERD lifestyle modification.

## 2010-08-22 NOTE — Assessment & Plan Note (Signed)
Patient indicates persistent fatigue, daytime sleepiness. He has notably increased his weight, has been noncompliant with his CPAP and also dc'ed his vitamin D within a few months of initiation in 2010. Therefore, there are multiple potential contributing factors towards his symptoms. - Encourage compliance with CPAP. - Encourage weight loss. - Will recheck vitamin D levels today. - Will check CBC and BMET today to see if electrolyte, or blood count derangements may be contributing. - Will check TSH.

## 2010-08-22 NOTE — Assessment & Plan Note (Addendum)
Supposed to be on CPAP, although the patient and is notably noncompliant with his CPAP. - Explained the long-term ramifications of uncontrolled OSA. - Recommended compliance with CPAP. - Recommended weight loss. - All questions were answered, and patient understands the information presented here

## 2010-08-22 NOTE — Assessment & Plan Note (Addendum)
BP Readings from Last 3 Encounters:  08/22/10 140/85  12/08/09 145/89  05/10/09 150/91   Recheck Blood pressure today was 138/ 84  Basic Metabolic Panel:    Component Value Date/Time   NA 140 07/05/2009 1934   K 4.3 07/05/2009 1934   CL 104 07/05/2009 1934   CO2 23 07/05/2009 1934   BUN 9 07/05/2009 1934   CREATININE 1.02 07/05/2009 1934   GLUCOSE 82 07/05/2009 1934   CALCIUM 9.5 07/05/2009 1934    Assessment: Hypertension control:   controlled  Progress toward goals:   at goal Barriers to meeting goals:  None, patient has gained weight since last visit.  Plan: Hypertension treatment:  - continue current medications - refill sent today. - Will check BMET today to assess renal function/ electrolytes should we need to escalate therapy on next visit. - Recommended weight loss to help with better blood pressure control. - Recommended better compliance with his CPAP to help with blood pressure control. - Will reassess in 6 months, consider escalation of therapy at that time if diet/ exercise modification are ineffective at controlling blood pressure. May consider CCB at that time as pt is African American.

## 2010-08-22 NOTE — Assessment & Plan Note (Signed)
Diagnosed in 2010, however the patient has been noncompliant with his vitamin D supplementation. Continues to express symptoms of fatigue at this time, which may be multifactorial, please see the fatigue assessment above. - Will recheck vitamin D 25-hydroxy level today. - Spoke with the patient regarding medication compliance, to avoid long-term complications.

## 2010-08-22 NOTE — Assessment & Plan Note (Addendum)
Patient isn't sure if he has had a tetanus shot within the past 5 years. He will therefore consult with his mother, and get back with the clinic regarding his tetanus shot status. - Plan Tdap booster next visit if tetanus does not been done in the last 5 years. - Check fasting lipid panel today, as the patient's last fasting lipid panel in 2011 showed mildly elevated LDL levels, and the patient has gained weight since that time. Of note, he has eaten a pop hurt prior to today's visit, for the levels will not be completely indicative of fasting.

## 2010-08-23 ENCOUNTER — Other Ambulatory Visit: Payer: Self-pay | Admitting: Internal Medicine

## 2010-08-23 DIAGNOSIS — E559 Vitamin D deficiency, unspecified: Secondary | ICD-10-CM

## 2010-08-24 MED ORDER — CALCIUM CARB-CHOLECALCIFEROL 500-600 MG-UNIT PO TABS: 500.0000 [IU] | ORAL_TABLET | Freq: Every day | ORAL | Status: DC

## 2010-08-24 NOTE — Progress Notes (Addendum)
Patient has persistent vitamin D deficiency, which is improved from prior levels 2 years ago. At this time, not severe enough to start high-dose vitamin D. Will instead start Vitamin D3 600 units daily. The pt was called and informed of the results, he understands the plan of care, and all questions were answered.   Also of note, the patient indicated to me over the phone that he would like for his mother to receive copies of his lab reports. I will therefore facilitate this information to be given to his mother at the patient's request.

## 2010-10-26 LAB — POCT RAPID STREP A: Streptococcus, Group A Screen (Direct): NEGATIVE

## 2011-01-16 ENCOUNTER — Other Ambulatory Visit: Payer: Self-pay | Admitting: Internal Medicine

## 2011-02-03 ENCOUNTER — Ambulatory Visit (INDEPENDENT_AMBULATORY_CARE_PROVIDER_SITE_OTHER): Payer: BC Managed Care – PPO | Admitting: Internal Medicine

## 2011-02-03 ENCOUNTER — Encounter: Payer: Self-pay | Admitting: Internal Medicine

## 2011-02-03 VITALS — BP 140/79 | HR 69 | Temp 97.5°F | Ht 74.0 in | Wt 355.7 lb

## 2011-02-03 DIAGNOSIS — H6120 Impacted cerumen, unspecified ear: Secondary | ICD-10-CM

## 2011-02-03 DIAGNOSIS — M25561 Pain in right knee: Secondary | ICD-10-CM

## 2011-02-03 DIAGNOSIS — I10 Essential (primary) hypertension: Secondary | ICD-10-CM

## 2011-02-03 DIAGNOSIS — Z23 Encounter for immunization: Secondary | ICD-10-CM

## 2011-02-03 DIAGNOSIS — M25569 Pain in unspecified knee: Secondary | ICD-10-CM

## 2011-02-03 MED ORDER — LOSARTAN POTASSIUM 50 MG PO TABS: 50.0000 mg | ORAL_TABLET | Freq: Every day | ORAL | Status: DC

## 2011-02-03 NOTE — Patient Instructions (Signed)
1.  Change the Benicar to Losartan 50 mg tablets.  Take 1 tablet daily for your blood pressure.  2.  For your knees work on flexibility of your legs as well as a good supportive shoe.  You can use Ibuprofen and ice for times when it hurts  3.  Continue all of your other medications as prescribed.  4.  Follow up in 2 weeks for a blood pressure recheck.

## 2011-02-03 NOTE — Progress Notes (Signed)
Subjective:   Patient ID: Dakota Nicholson male   DOB: [birth year] 26 y.o.   MRN: [id number]  HPI: [name] is a 26 y.o. man who presents to clinic today with several complaints.    He states that his blood pressure medication has gotten to be expensive.  He has been on Olmesartan for sometime and states that it works well but that it is expensive.  His mother who has the same insurance gets Losartan which is much cheaper and he wants to change to that.  He denies any signs of hypertension including headaches, blurry vision, or chest pain.    He also wnts ears looked at.  He states that he has noticed some decreased hearing in his left ear and that he has tried to clean them out but feels like something is stuck in there.  He has some mild ear pain occasionally but denies any redness or drainage from the ear.   He states he has been having some problems with his knees.  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  He states he has pain in both knees and that the pain is on the outside below the knee cap.  He states that they pop and click sometimes and hurt more when going up stairs or if he bumps his knees.  He denies any weakness, numbness, or inability to stand on his legs.   Past Medical History  Diagnosis Date  . Allergy   . Hypertension    Current Outpatient Prescriptions  Medication Sig Dispense Refill  . albuterol (PROAIR HFA) 108 (90 BASE) MCG/ACT inhaler Inhale 2 puffs into the lungs every 6 (six) hours as needed.  1 Inhaler  11  . Calcium Carb-Cholecalciferol 500-600 MG-UNIT TABS Take 500-600 Units by mouth daily.  30 tablet  5  . ibuprofen (ADVIL,MOTRIN) 600 MG tablet Take 600 mg by mouth 3 (three) times daily as needed.        Marland Kitchen NEXIUM 40 MG capsule TAKE 1 CAPSULE (40 MG TOTAL) BY MOUTH DAILY.  30 capsule  PRN  . olmesartan (BENICAR) 40 MG tablet Take 40 mg by mouth daily.         No family history on file. History   Social  History  . Marital Status: Single    Spouse Name: N/A    Number of Children: N/A  . Years of Education: N/A   Social History Main Topics  . Smoking status: Former Smoker    Quit date: 08/21/2008  . Smokeless tobacco: None  . Alcohol Use: Yes  . Drug Use: None  . Sexually Active: None   Other Topics Concern  . 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  HEENT: Positive for hearing change and ear pain.  Denies photophobia, eye pain, redness, congestion, sore throat, rhinorrhea, sneezing, mouth sores, trouble swallowing, neck pain, neck stiffness and tinnitus.   Respiratory: Denies SOB, DOE, cough, chest tightness,  and wheezing.   Cardiovascular: Denies chest pain, palpitations and leg swelling.  Gastrointestinal: Denies nausea, vomiting, abdominal pain, diarrhea, constipation, blood in stool and abdominal distention.  Genitourinary: Denies dysuria, urgency, frequency, hematuria, flank pain and difficulty urinating.  Musculoskeletal: Positive for knee pain. Denies myalgias, back pain, joint swelling, arthralgias and gait problem.  Skin: Denies pallor, rash and wound.  Neurological: Denies dizziness, seizures, syncope,  weakness, light-headedness, numbness and headaches.  Hematological: Denies adenopathy. Easy bruising, personal or family bleeding history  Psychiatric/Behavioral: Denies suicidal ideation, mood changes, confusion, nervousness, sleep disturbance and agitation  Objective:  Physical Exam: Filed Vitals:   02/03/11 1441  BP: 140/79  Pulse: 69  Temp: 97.5 F (36.4 C)  TempSrc: Oral  Height: 6\' 2"  (1.88 m)  Weight: 355 lb 11.2 oz (161.344 kg)   Constitutional: Vital signs reviewed.  Patient is a well-developed and well-nourished man in no acute distress and cooperative with exam. Alert and oriented x3.  Head:  Normocephalic and atraumatic Ear: Right ear canal clear with normal TM.  Left is completely impacted with Cerumen and unable to visualize the TM.   Mouth: no erythema or exudates, MMM Eyes: PERRL, EOMI, conjunctivae normal, No scleral icterus.  Neck: Supple, Trachea midline normal ROM, No JVD, mass, thyromegaly, or carotid bruit present.  Cardiovascular: RRR, S1 normal, S2 normal, no MRG, pulses symmetric and intact bilaterally Pulmonary/Chest: CTAB, no wheezes, rales, or rhonchi Abdominal: Soft. Non-tender, non-distended, bowel sounds are normal, no masses, organomegaly, or guarding present.  GU: no CVA tenderness Musculoskeletal: There is mild right infrapatellar swelling noted.  Lochman's, McMurray's, anterior, and posterior drawer sign are negative.  Mild pain with varus and valgus movement.  There is crepitus with patella motion.  Pain with palpation over the patellar ligament and tibial tuberosity.  No joint deformities, erythema, or stiffness,  Hematology: no cervical, inginal, or axillary adenopathy.  Neurological: A&O x3, Strength is normal and symmetric bilaterally, cranial nerve II-XII are grossly intact, no focal motor deficit, sensory intact to light touch bilaterally.  Skin: Warm, dry and intact. No rash, cyanosis, or clubbing.  Psychiatric: Normal mood and affect. speech and behavior is normal. Judgment and thought content normal. Cognition and memory are normal.   Assessment & Plan:

## 2011-02-04 ENCOUNTER — Emergency Department (INDEPENDENT_AMBULATORY_CARE_PROVIDER_SITE_OTHER)
Admission: EM | Admit: 2011-02-04 | Discharge: 2011-02-04 | Disposition: A | Discharge status: Home / Self Care | Payer: BC Managed Care – PPO | Source: Home / Self Care | Attending: Emergency Medicine | Admitting: Emergency Medicine

## 2011-02-04 ENCOUNTER — Encounter: Payer: Self-pay | Admitting: Emergency Medicine

## 2011-02-04 DIAGNOSIS — H6120 Impacted cerumen, unspecified ear: Secondary | ICD-10-CM

## 2011-02-04 HISTORY — DX: Gastro-esophageal reflux disease without esophagitis: K21.9

## 2011-02-04 NOTE — ED Provider Notes (Addendum)
History     CSN: [id number]  Arrival date & time 02/04/11  1422   First MD Initiated Contact with Patient 02/04/11 1423      Chief Complaint  Patient presents with  . Cerumen Impaction    (Consider location/radiation/quality/duration/timing/severity/associated sxs/prior treatment) HPI This patient went to the doctor yesterday and was treated with an ear lavage but was not successful. He returns to our clinic instead today and would like to have [place] look at it. He has decreased hearing and a sensation of fullness on the right ear only. No upper respiratory symptoms. No pain.  Past Medical History  Diagnosis Date  . Allergy   . Hypertension     History reviewed. No pertinent past surgical history.  History reviewed. No pertinent family history.  History  Substance Use Topics  . Smoking status: Former Smoker    Quit date: 08/21/2008  . Smokeless tobacco: Not on file  . Alcohol Use: Yes      Review of Systems  Allergies  Review of patient's allergies indicates no known allergies.  Home Medications   Current Outpatient Rx  Name Route Sig Dispense Refill  . ALBUTEROL SULFATE HFA 108 (90 BASE) MCG/ACT IN AERS Inhalation Inhale 2 puffs into the lungs every 6 (six) hours as needed. 1 Inhaler 11  . CALCIUM CARB-CHOLECALCIFEROL 500-600 MG-UNIT PO TABS Oral Take 500-600 Units by mouth daily. 30 tablet 5  . IBUPROFEN 600 MG PO TABS Oral Take 600 mg by mouth 3 (three) times daily as needed.      Marland Kitchen LOSARTAN POTASSIUM 50 MG PO TABS Oral Take 1 tablet (50 mg total) by mouth daily. 30 tablet 2  . NEXIUM 40 MG PO CPDR  TAKE 1 CAPSULE (40 MG TOTAL) BY MOUTH DAILY. 30 capsule PRN    BP 161/89  Pulse 64  Temp(Src) 98.4 F (36.9 C) (Oral)  Resp 18  Ht 6\' 2"  (1.88 m)  Wt 355 lb (161.027 kg)  BMI 45.58 kg/m2  SpO2 99%  Physical Exam  Nursing note and vitals reviewed. Constitutional: He is oriented to person, place, and time. He appears well-developed and well-nourished.    HENT:  Head: Normocephalic and atraumatic.  Nose: Nose normal.  Mouth/Throat: Oropharynx is clear and moist and mucous membranes are normal.       Right ear has complete cerumen impaction. Left ear canal and tympanic membrane are normal.  Eyes: No scleral icterus.  Neck: Neck supple.  Cardiovascular: Regular rhythm and normal heart sounds.   Pulmonary/Chest: Effort normal and breath sounds normal. No respiratory distress.  Neurological: He is alert and oriented to person, place, and time.  Skin: Skin is warm and dry.  Psychiatric: He has a normal mood and affect. His speech is normal.    ED Course  Procedures (including critical care time)  Labs Reviewed - No data to display No results found.   No diagnosis found.    MDM   Ear lavage is completed by the nurse.  It was unsuccessful. Because the ear wax is very deep I do not feel comfortable taking a curette blindly. I told them to use D. proximal tonight and come back tomorrow for another attempt. If still unsuccessful at that time, and he'll need followup with an ENT. The mom is here and understands that.    Lily Kocher, MD 02/04/11 1520     The patient returned the following day on January 20 and overnight he used the proximal a few times. He still  feels that there is wax in his ear. We again lavaged it and all the wax was removed without any difficulty. He does have a low dose erythema and swelling secondary to either wax or the actual lavage itself. If he is having any types of swelling or pain, he will call her in a few days and we will call him in ear drops for this. Otherwise patient with no problems and will call if further symptoms.  Lily Kocher, MD 02/05/11 [phone]

## 2011-02-04 NOTE — ED Notes (Signed)
Right ear cerumen; treated yesterday with lavage, but not successful.

## 2011-02-17 ENCOUNTER — Encounter: Payer: BC Managed Care – PPO | Admitting: Internal Medicine

## 2011-03-12 DIAGNOSIS — H6120 Impacted cerumen, unspecified ear: Secondary | ICD-10-CM | POA: Insufficient documentation

## 2011-03-12 NOTE — Assessment & Plan Note (Signed)
Lab Results  Component Value Date   NA 138 08/22/2010   K 4.1 08/22/2010   CL 105 08/22/2010   CO2 24 08/22/2010   BUN 6 08/22/2010   CREATININE 0.75 08/22/2010   CREATININE 1.02 07/05/2009    BP Readings from Last 3 Encounters:  02/04/11 161/89  02/03/11 140/79  08/22/10 140/85    Assessment: Hypertension control:  controlled  Progress toward goals:  at goal Barriers to meeting goals:  Cost of Benicar  Plan: Hypertension treatment:  Will change to losartan and monitor him.  He shouldn't need new labs since we are doing a straight dose conversion for his ARB to a different ARB.

## 2011-03-12 NOTE — Assessment & Plan Note (Signed)
He has cerumen impaction on the left which is likely the cause of his hearing change. We discussed not using q-tips for cleaning the ears.  Debbie placed Debrox and then flushed with saline and got mild return.  We will have them use Debrox to soften it and return for a reattempt at flushing to clear it out.  If that doesn't work he may need to see ENT.

## 2011-03-12 NOTE — Assessment & Plan Note (Signed)
His knee pain appears to be secondary to his Osgood-Slaughter's disease but may also be due to mild arthritic changes because of his weight.  We discussed weight loss and how that may help him and he states he will try.

## 2011-10-13 ENCOUNTER — Encounter: Payer: Self-pay | Admitting: Internal Medicine

## 2011-10-13 ENCOUNTER — Ambulatory Visit (INDEPENDENT_AMBULATORY_CARE_PROVIDER_SITE_OTHER): Payer: BC Managed Care – PPO | Admitting: Internal Medicine

## 2011-10-13 VITALS — BP 145/77 | HR 75 | Temp 97.5°F | Ht 74.0 in | Wt 354.6 lb

## 2011-10-13 DIAGNOSIS — Z299 Encounter for prophylactic measures, unspecified: Secondary | ICD-10-CM

## 2011-10-13 DIAGNOSIS — Z6841 Body Mass Index (BMI) 40.0 and over, adult: Secondary | ICD-10-CM

## 2011-10-13 DIAGNOSIS — Z23 Encounter for immunization: Secondary | ICD-10-CM

## 2011-10-13 DIAGNOSIS — I10 Essential (primary) hypertension: Secondary | ICD-10-CM

## 2011-10-13 DIAGNOSIS — K219 Gastro-esophageal reflux disease without esophagitis: Secondary | ICD-10-CM

## 2011-10-13 MED ORDER — LOSARTAN POTASSIUM 50 MG PO TABS: 50.0000 mg | ORAL_TABLET | Freq: Every day | ORAL | Status: DC

## 2011-10-13 MED ORDER — ESOMEPRAZOLE MAGNESIUM 40 MG PO CPDR: 40.0000 mg | DELAYED_RELEASE_CAPSULE | Freq: Every day | ORAL | Status: DC

## 2011-10-13 NOTE — Progress Notes (Signed)
  Subjective:   Patient ID: Dakota Nicholson male   DOB: 1985-03-05 26 y.o.   MRN: [id number]  HPI: [name] is a 26 y.o. man who presents to the clinic today for follow up on his chronic medical conditions including including hypertension, morbid obesity, and GERD. See Problem focused Assessment and Plan for full details of his chronic medical conditions.   Past Medical History  Diagnosis Date  . Allergy   . Hypertension   . GERD (gastroesophageal reflux disease)    Current Outpatient Prescriptions  Medication Sig Dispense Refill  . albuterol (PROAIR HFA) 108 (90 BASE) MCG/ACT inhaler Inhale 2 puffs into the lungs every 6 (six) hours as needed.  1 Inhaler  11  . Calcium Carb-Cholecalciferol 500-600 MG-UNIT TABS Take 500-600 Units by mouth daily.  30 tablet  5  . ibuprofen (ADVIL,MOTRIN) 600 MG tablet Take 600 mg by mouth 3 (three) times daily as needed.        Marland Kitchen losartan (COZAAR) 50 MG tablet Take 1 tablet (50 mg total) by mouth daily.  30 tablet  2  . NEXIUM 40 MG capsule TAKE 1 CAPSULE (40 MG TOTAL) BY MOUTH DAILY.  30 capsule  PRN   Family History  Problem Relation Age of Onset  . Diabetes Mother   . Hypertension Mother   . Asthma Brother    History   Social History  . Marital Status: Single    Spouse Name: N/A    Number of Children: N/A  . Years of Education: N/A   Social History Main Topics  . Smoking status: Former Smoker    Quit date: 08/21/2008  . Smokeless tobacco: None  . Alcohol Use: Yes  . Drug Use: None  . Sexually Active: None   Other Topics Concern  . 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   Objective:  Physical Exam: Filed Vitals:   10/13/11 1438  BP: 145/77  Pulse: 75  Temp: 97.5 F (36.4 C)  TempSrc: Oral  Height: 6\' 2"  (1.88 m)  Weight: 354 lb 9.6 oz (160.846 kg)    SpO2: 99%   Constitutional: Vital signs reviewed.  Patient is a well-developed and well-nourished morbidly obese young man in no acute distress and cooperative with exam. Alert and oriented x3.  Head: Normocephalic and atraumatic Ear: TM normal bilaterally Mouth: no erythema or exudates, MMM Eyes: PERRL, EOMI, conjunctivae normal, No scleral icterus.  Neck: Supple, Trachea midline normal ROM, No JVD, mass, thyromegaly, or carotid bruit present.  Cardiovascular: RRR, S1 normal, S2 normal, no MRG, pulses symmetric and intact bilaterally Pulmonary/Chest: CTAB, no wheezes, rales, or rhonchi Abdominal: Soft. Non-tender, non-distended, bowel sounds are normal, no masses, organomegaly, or guarding present.  GU: no CVA tenderness Musculoskeletal: No joint deformities, erythema, or stiffness, ROM full and no nontender Hematology: no cervical, inginal, or axillary adenopathy.  Neurological: A&O x3, Strength is normal and symmetric bilaterally, cranial nerve II-XII are grossly intact, no focal motor deficit, sensory intact to light touch bilaterally.  Skin: Warm, dry and intact. No rash, cyanosis, or clubbing.  Psychiatric: Normal mood and affect. speech and behavior is normal. Judgment and thought content normal. Cognition and memory are normal.   Assessment & Plan:

## 2011-10-13 NOTE — Patient Instructions (Signed)
1.  Make sure you are taking your blood pressure medication every day.  2.  Remember to keep working on healthy habits.  Smoking is a habit that is harder to break the longer you do it.  Not starting is the best thing.  3.  Keep a food diary for the next few months.  Keep track of portion sizes by weighing your food as well as what you are eating.  4.  Follow up in about 3 months.  The goal is to have lost about 15-25 lbs.   Calorie Counting Diet A calorie counting diet requires you to eat the number of calories that are right for you in a day. Calories are the measurement of how much energy you get from the food you eat. Eating the right amount of calories is important for staying at a healthy weight. If you eat too many calories, your body will store them as fat and you may gain weight. If you eat too few calories, you may lose weight. Counting the number of calories you eat during a day will help you know if you are eating the right amount. A Registered Dietitian can determine how many calories you need in a day. The amount of calories needed varies from person to person. If your goal is to lose weight, you will need to eat fewer calories. Losing weight can benefit you if you are overweight or have health problems such as heart disease, high blood pressure, or diabetes. If your goal is to gain weight, you will need to eat more calories. Gaining weight may be necessary if you have a certain health problem that causes your body to need more energy. TIPS Whether you are increasing or decreasing the number of calories you eat during a day, it may be hard to get used to changes in what you eat and drink. The following are tips to help you keep track of the number of calories you eat.  Measure foods at home with measuring cups. This helps you know the amount of food and number of calories you are eating.   Restaurants often serve food in amounts that are larger than 1 serving. While eating out,  estimate how many servings of a food you are given. For example, a serving of cooked rice is  cup or about the size of half of a fist. Knowing serving sizes will help you be aware of how much food you are eating at restaurants.   Ask for smaller portion sizes or child-size portions at restaurants.   Plan to eat half of a meal at a restaurant. Take the rest home or share the other half with a friend.   Read the Nutrition Facts panel on food labels for calorie content and serving size. You can find out how many servings are in a package, the size of a serving, and the number of calories each serving has.   For example, a package might contain 3 cookies. The Nutrition Facts panel on that package says that 1 serving is 1 cookie. Below that, it will say there are 3 servings in the container. The calories section of the Nutrition Facts label says there are 90 calories. This means there are 90 calories in 1 cookie (1 serving). If you eat 1 cookie you have eaten 90 calories. If you eat all 3 cookies, you have eaten 270 calories (3 servings x 90 calories = 270 calories).  The list below tells you how big or small some common  portion sizes are.  1 oz.........4 stacked dice.   3 oz........Marland KitchenDeck of cards.   1 tsp.......Marland KitchenTip of little finger.   1 tbs......Marland KitchenMarland KitchenThumb.   2 tbs.......Marland KitchenGolf ball.    cup......Marland KitchenHalf of a fist.   1 cup.......Marland KitchenA fist.  KEEP A FOOD LOG Write down every food item you eat, the amount you eat, and the number of calories in each food you eat during the day. At the end of the day, you can add up the total number of calories you have eaten. It may help to keep a list like the one below. Find out the calorie information by reading the Nutrition Facts panel on food labels. Breakfast  Bran cereal (1 cup, 110 calories).   Fat-free milk ( cup, 45 calories).  Snack  Apple (1 medium, 80 calories).  Lunch  Spinach (1 cup, 20 calories).   Tomato ( medium, 20 calories).   Chicken  breast strips (3 oz, 165 calories).   Shredded cheddar cheese ( cup, 110 calories).   Light [place] dressing (2 tbs, 60 calories).   Whole-wheat bread (1 slice, 80 calories).   Tub margarine (1 tsp, 35 calories).   Vegetable soup (1 cup, 160 calories).  Dinner  Pork chop (3 oz, 190 calories).   Brown rice (1 cup, 215 calories).   Steamed broccoli ( cup, 20 calories).   Strawberries (1  cup, 65 calories).   Whipped cream (1 tbs, 50 calories).  Daily Calorie Total: 1425 Document Released: 01/02/2005 Document Revised: 12/22/2010 Document Reviewed: 06/29/2006 Exit[employer] Patient Information 2012 Exit[employer], [place].  WEIGHT REDUCTION:  Strategies: A healthy weight loss program includes:  A calorie restricted diet based on individual calorie needs.   Increased physical activity (exercise).  An exercise program is just as important as the right low-calorie diet.    An unhealthy weight loss program includes:  Fasting.   Fad diets.   Supplements and drugs.  These choices do not succeed in long-term weight control.   Home Care Instructions: To help you make the needed dietary changes:   Exercise and perform physical activity as directed by your caregiver.   Keep a daily record of everything you eat. There are many free websites to help you with this. It may be helpful to measure your foods so you can determine if you are eating the correct portion sizes.   Use low-calorie cookbooks or take special cooking classes.   Avoid alcohol. Drink more water and drinks with no calories.   Take vitamins and supplements only as recommended by your caregiver.   Weight loss support groups, Registered Dieticians, counselors, and stress reduction education can also be very helpful.   ________________________________________________________________________  DASH DIET:  The DASH diet stands for "Dietary Approaches to Stop Hypertension." It is a healthy eating plan that has been shown  to reduce high blood pressure (hypertension) in as little as 14 days, while also possibly providing other significant health benefits. These other health benefits include reducing the risk of breast cancer after menopause and reducing the risk of type 2 diabetes, heart disease, colon cancer, and stroke. Health benefits also include weight loss and slowing kidney failure in patients with chronic kidney disease.   Diet guidelines: Limit salt (sodium). Your diet should contain less than 1500 mg of sodium daily.  Limit refined or processed carbohydrates. Your diet should include mostly whole grains. Desserts and added sugars should be used sparingly.  Include small amounts of heart-healthy fats. These types of fats include nuts, oils, and tub margarine.  Limit saturated and trans fats. These fats have been shown to be harmful in the body.   Choosing Foods: The following food groups are based on a 2000 calorie diet. See your Registered Dietitian for individual calorie needs.  Grains and Grain Products (6 to 8 servings daily)  Eat More Often: Whole-wheat bread, brown rice, whole-grain or wheat pasta, quinoa, popcorn without added fat or salt (air popped).  Eat Less Often: White bread, white pasta, white rice, cornbread.  Vegetables (4 to 5 servings daily)  Eat More Often: Fresh, frozen, and canned vegetables. Vegetables may be raw, steamed, roasted, or grilled with a minimal amount of fat.  Eat Less Often/Avoid: Creamed or fried vegetables. Vegetables in a cheese sauce.  Fruit (4 to 5 servings daily)  Eat More Often: All fresh, canned (in natural juice), or frozen fruits. Dried fruits without added sugar. One hundred percent fruit juice ( cup [237 mL] daily).  Eat Less Often: Dried fruits with added sugar. Canned fruit in light or heavy syrup.  [employer], Fish, and Poultry (2 servings or less daily. One serving is 3 to 4 oz [85-114 g]).  Eat More Often: Ninety percent or leaner ground beef, tenderloin,  sirloin. Round cuts of beef, chicken breast, [place] breast. All fish. Grill, bake, or broil your meat. Nothing should be fried.  Eat Less Often/Avoid: Fatty cuts of meat, [place], or chicken leg, thigh, or wing. Fried cuts of meat or fish.  Dairy (2 to 3 servings)  Eat More Often: Low-fat or fat-free milk, low-fat plain or light yogurt, reduced-fat or part-skim cheese.  Eat Less Often/Avoid: Milk (whole, 2%, skim, or chocolate). Whole milk yogurt. Full-fat cheeses.  Nuts, Seeds, and Legumes (4 to 5 servings per week)  Eat More Often: All without added salt.  Eat Less Often/Avoid: Salted nuts and seeds, canned beans with added salt.  Fats and Sweets (limited)  Eat More Often: Vegetable oils, tub margarines without trans fats, sugar-free gelatin. Mayonnaise and salad dressings.  Eat Less Often/Avoid: Coconut oils, palm oils, butter, stick margarine, cream, half and half, cookies, candy, pie.   ________________________________________________________________________  Smoking Cessation Tips 1-800-QUIT-NOW  This document explains the best ways for you to quit smoking and new treatments to help. It lists new medicines that can double or triple your chances of quitting and quitting for good. It also considers ways to avoid relapses and concerns you may have about quitting, including weight gain.   Nicotine: A Powerful Addiction If you have tried to quit smoking, you know how hard it can be. It is hard because nicotine is a very addictive drug. For some people, it can be as addictive as heroin or cocaine. Usually, people make 2 or 3 tries, or more, before finally being able to quit. Each time you try to quit, you can learn about what helps and what hurts. Quitting takes hard work and a lot of effort, but you can quit smoking.   Quitting smoking is one of the most important things you will ever do You will live longer, feel better, and live better.  The impact on your body of quitting smoking is felt  almost immediately:   Five keys to quitting: Studies have shown that these 5 steps will help you quit smoking and quit for good. You have the best chances of quitting if you use them together:   1. GET READY  Set a quit date.  Change your environment.  Get rid of ALL cigarettes, ashtrays, matches, and lighters  in your home, car, and place of work.  Do not let people smoke in your home.  Review your past attempts to quit. Think about what worked and what did not.  Once you quit, do not smoke. NOT EVEN A PUFF!   2. GET SUPPORT AND ENCOURAGEMENT  Tell your family, friends, and coworkers that you are going to quit and need their support. Ask them not to smoke around you.  Get individual, group, or telephone counseling and support.  Many smokers find one or more of the many self-help books available useful in helping them quit and stay off tobacco.   3. LEARN NEW SKILLS AND BEHAVIORS  Try to distract yourself from urges to smoke. Talk to someone, go for a walk, or occupy your time with a task.  When you first try to quit, change your routine. Take a different route to work. Drink tea instead of coffee. Eat breakfast in a different place.  Do something to reduce your stress. Take a hot bath, exercise, or read a book.  Plan something enjoyable to do every day. Reward yourself for not smoking.  Explore interactive web-based programs that specialize in helping you quit.   4. GET MEDICINE AND USE IT CORRECTLY .  Medicines can help you stop smoking and decrease the urge to smoke. Combining medicine with the above behavioral methods and support can quadruple your chances of successfully quitting smoking.  Talk with your doctor about these options.  5. BE PREPARED FOR RELAPSE OR DIFFICULT SITUATIONS  Most relapses occur within the first 3 months after quitting. Do not be discouraged if you start smoking again. Remember, most people try several times before they finally quit.  You may have symptoms  of withdrawal because your body is used to nicotine. You may crave cigarettes, be irritable, feel very hungry, cough often, get headaches, or have difficulty concentrating.  The withdrawal symptoms are only temporary. They are strongest when you first quit, but they will go away within 10 to 14 days.   Quitting takes hard work and a lot of effort, but you can quit smoking.   FOR MORE INFORMATION  Smokefree.gov (http://www.davis-sullivan.com/) provides free, accurate, evidence-based information and professional assistance to help support the immediate and long-term needs of people trying to quit smoking.  Document Released: 12/27/2000 Document Re-Released: 06/22/2009  [facility] Patient Information 2011 [place], [place].

## 2011-12-05 IMAGING — CR DG KNEE COMPLETE 4+V*L*
4 series · 4 of 4 positions shown · non-contrast
Comparison: None.

CLINICAL DATA: Fell 1 week ago - pain and swelling of the knee

LEFT KNEE - COMPLETE 4+ VIEW

[t knee ap left]
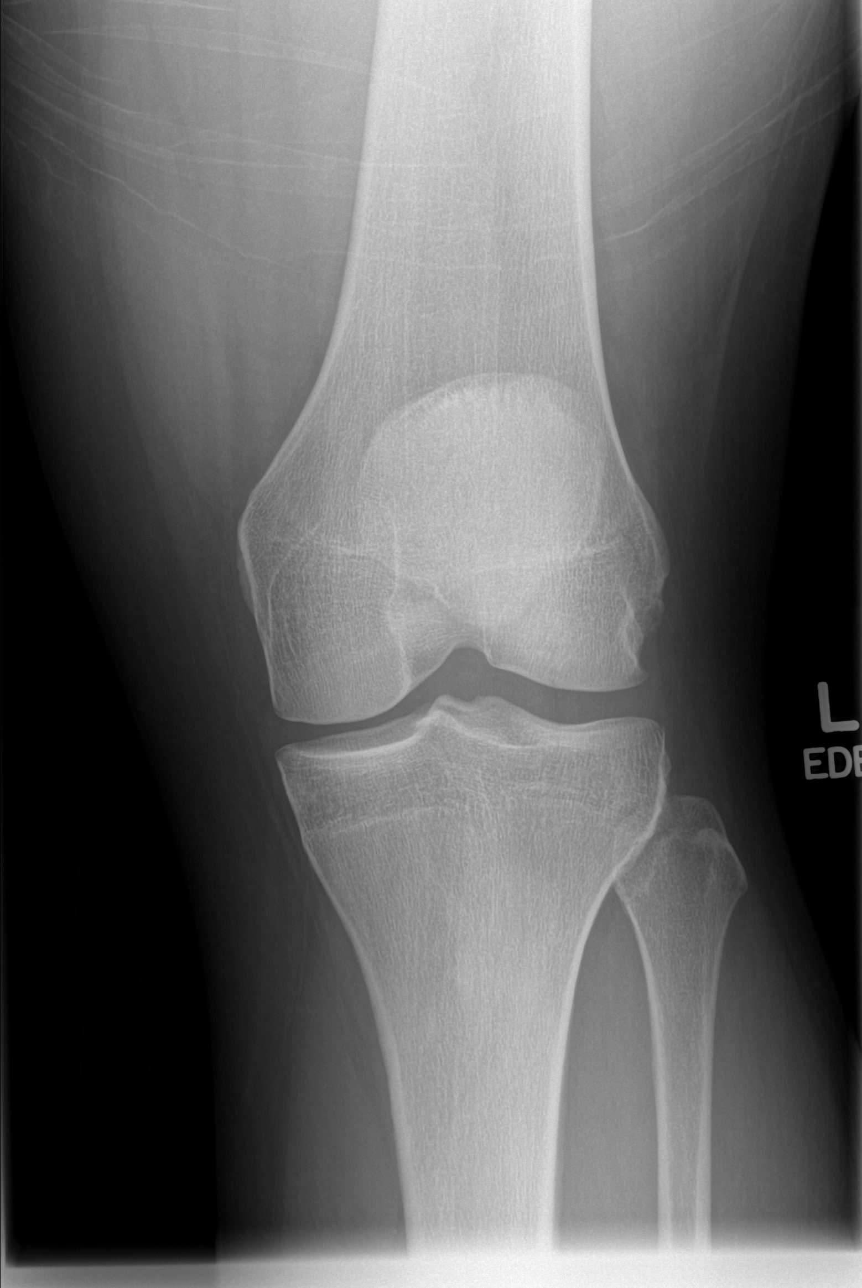

[t knee oblique left (1 of 2)]
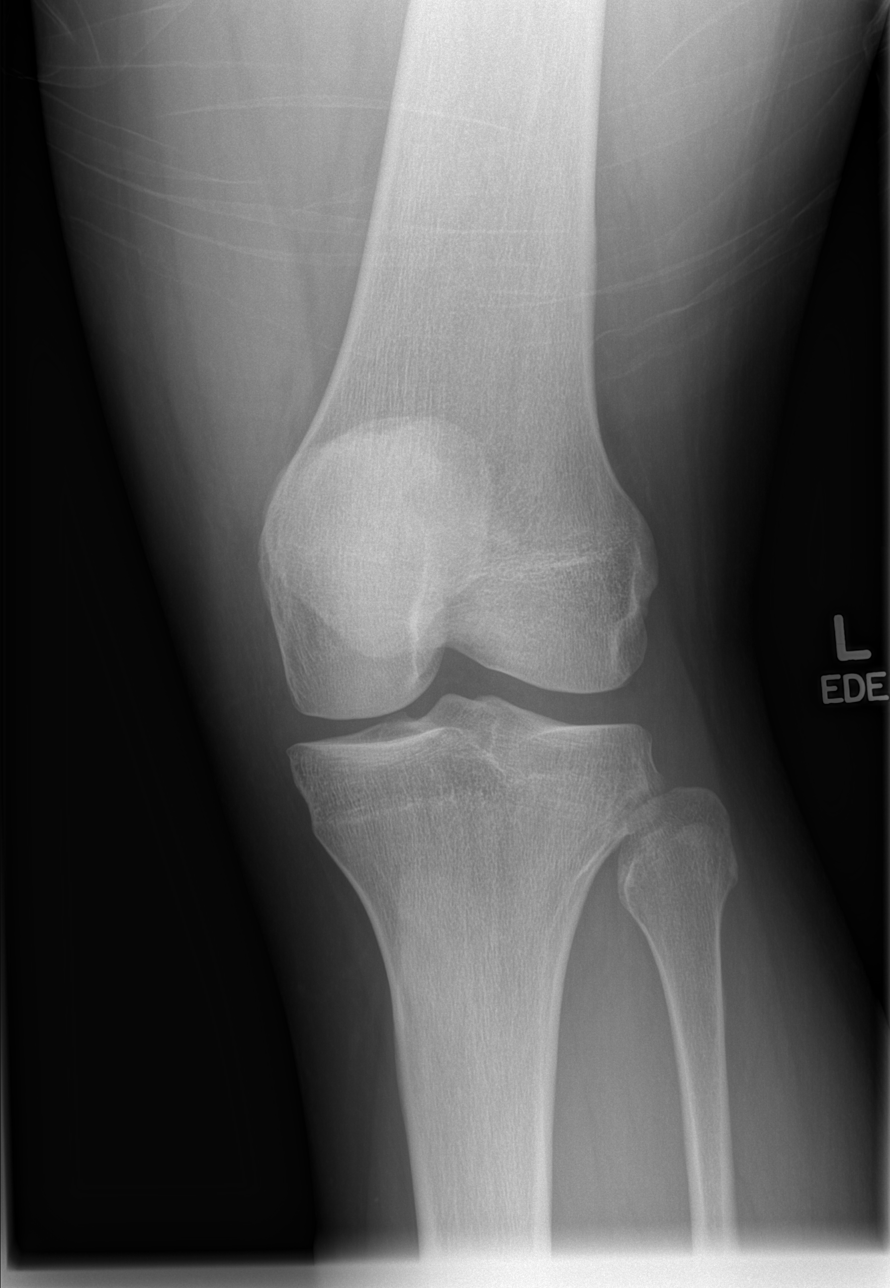

[t knee oblique left (2 of 2)]
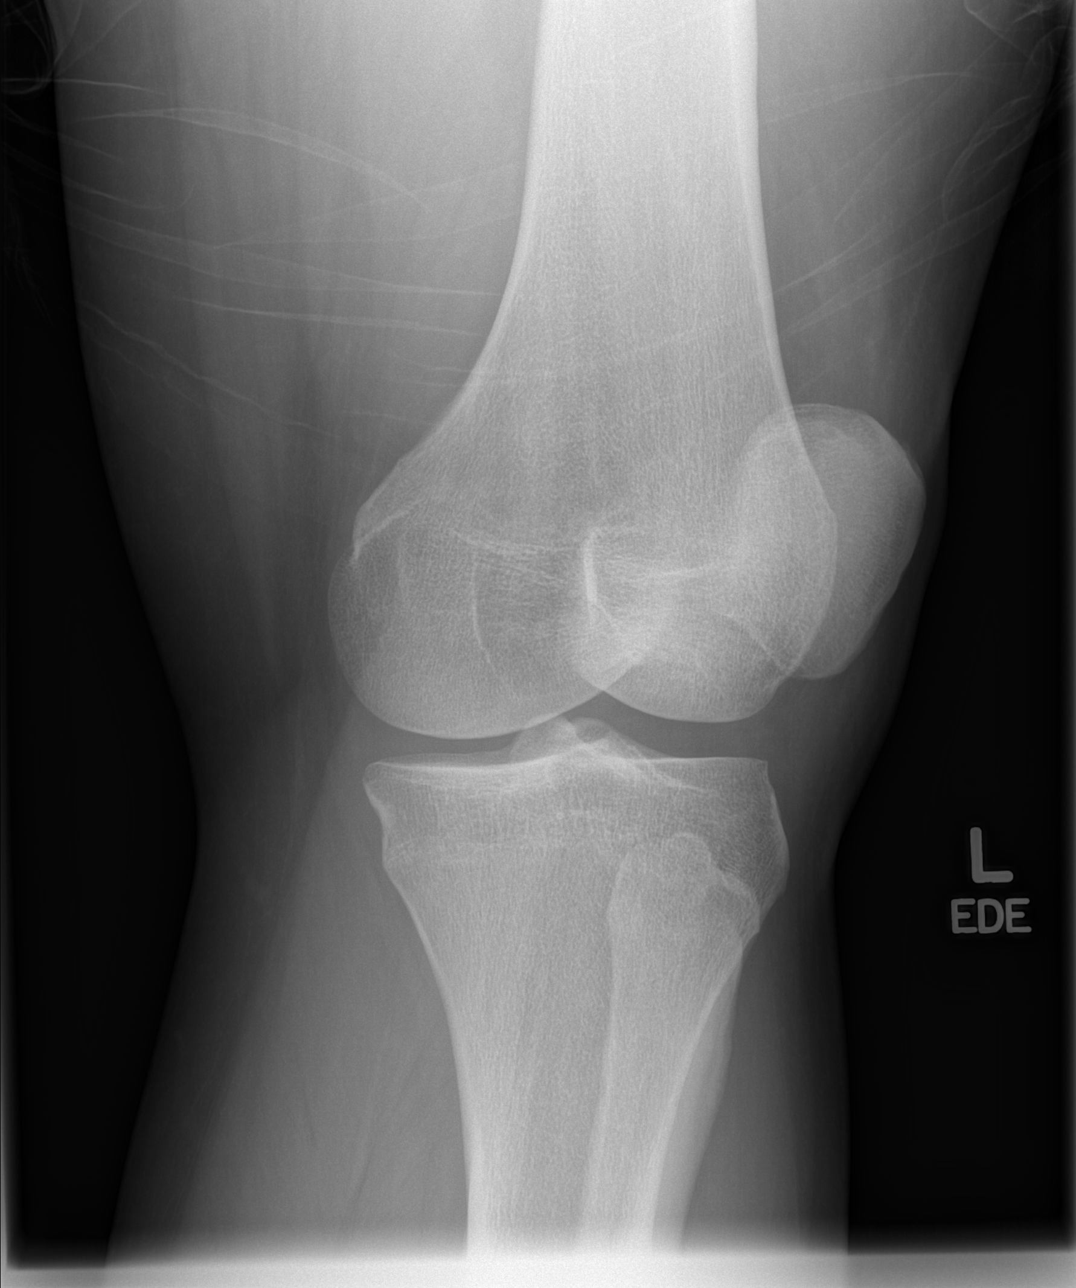

[t knee lat left]
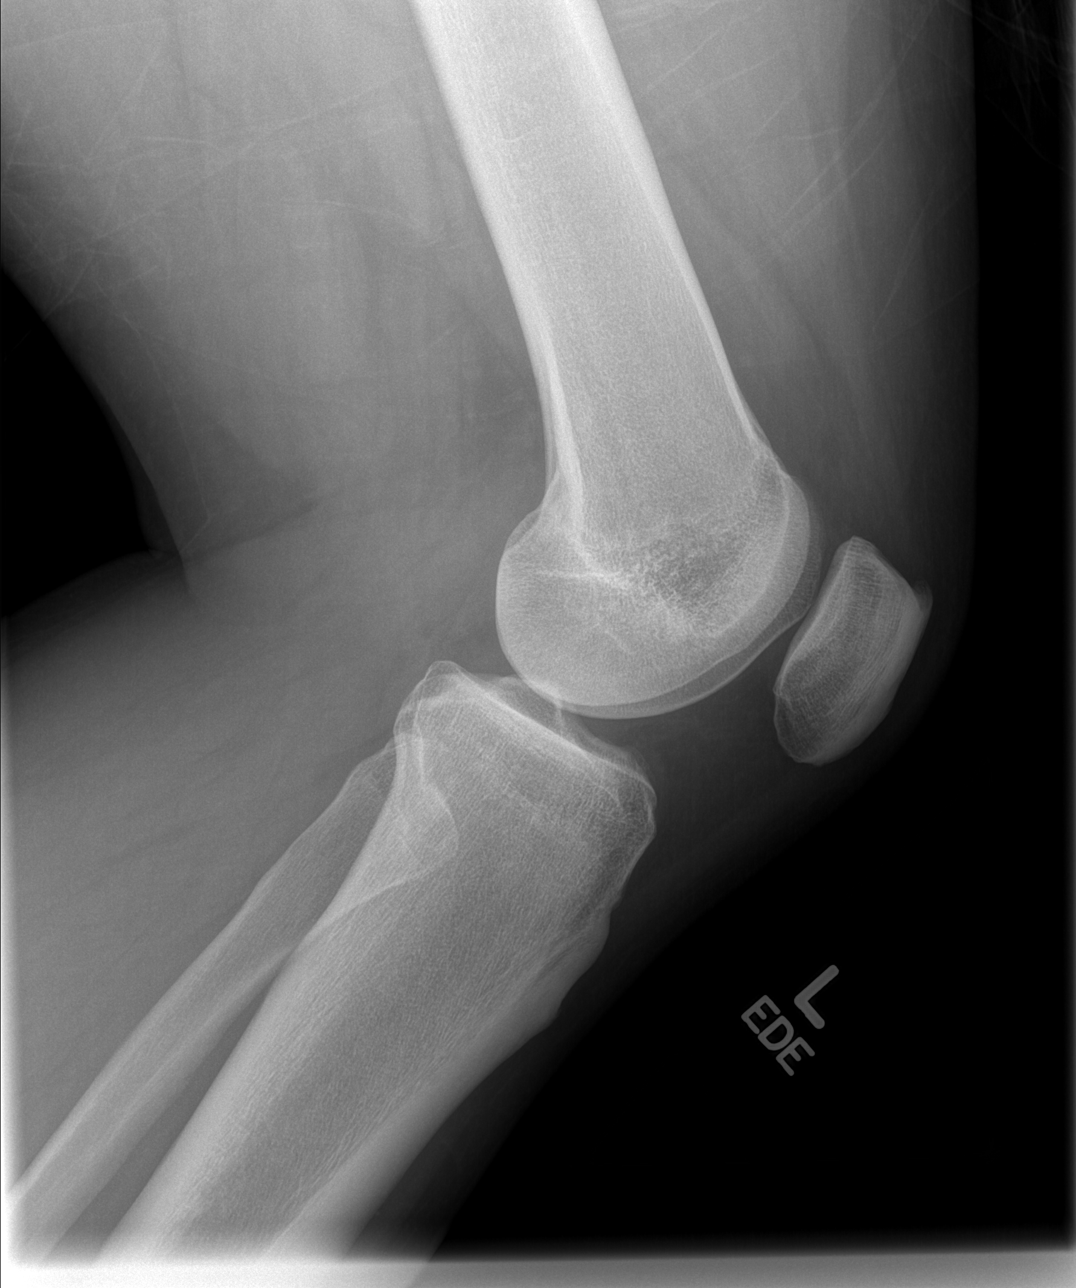

[4 of 4 positions shown; findings below may reference images not displayed]

FINDINGS: No definite fracture or dislocation.  There does appear
to be a small to moderate sized joint effusion on the lateral view.
There is minor spurring of the patella.
IMPRESSION: Probable joint effusion - otherwise unremarkable.

## 2012-04-09 ENCOUNTER — Ambulatory Visit: Payer: BC Managed Care – PPO | Admitting: Internal Medicine

## 2012-05-17 ENCOUNTER — Encounter: Payer: Self-pay | Admitting: Internal Medicine

## 2012-05-17 ENCOUNTER — Ambulatory Visit (INDEPENDENT_AMBULATORY_CARE_PROVIDER_SITE_OTHER): Payer: BC Managed Care – PPO | Admitting: Internal Medicine

## 2012-05-17 VITALS — BP 132/78 | HR 87 | Temp 99.0°F | Ht 73.0 in | Wt 350.7 lb

## 2012-05-17 DIAGNOSIS — F988 Other specified behavioral and emotional disorders with onset usually occurring in childhood and adolescence: Secondary | ICD-10-CM

## 2012-05-17 DIAGNOSIS — K219 Gastro-esophageal reflux disease without esophagitis: Secondary | ICD-10-CM

## 2012-05-17 DIAGNOSIS — G4700 Insomnia, unspecified: Secondary | ICD-10-CM | POA: Insufficient documentation

## 2012-05-17 DIAGNOSIS — F341 Dysthymic disorder: Secondary | ICD-10-CM

## 2012-05-17 DIAGNOSIS — I10 Essential (primary) hypertension: Secondary | ICD-10-CM

## 2012-05-17 LAB — COMPREHENSIVE METABOLIC PANEL
ALT: 56 U/L — ABNORMAL HIGH (ref 0–53)
AST: 34 U/L (ref 0–37)
Calcium: 9.2 mg/dL (ref 8.4–10.5)
Chloride: 107 mEq/L (ref 96–112)
Creat: 1.03 mg/dL (ref 0.50–1.35)
Potassium: 4.2 mEq/L (ref 3.5–5.3)
Sodium: 141 mEq/L (ref 135–145)

## 2012-05-17 MED ORDER — ESOMEPRAZOLE MAGNESIUM 40 MG PO CPDR: 40.0000 mg | DELAYED_RELEASE_CAPSULE | Freq: Every day | ORAL | Status: DC

## 2012-05-17 MED ORDER — LOSARTAN POTASSIUM 50 MG PO TABS: 50.0000 mg | ORAL_TABLET | Freq: Every day | ORAL | Status: DC

## 2012-05-17 MED ORDER — AMPHETAMINE-DEXTROAMPHET ER 10 MG PO CP24: 10.0000 mg | ORAL_CAPSULE | ORAL | Status: DC

## 2012-05-17 NOTE — Patient Instructions (Addendum)
1.  Start Adderall 10 mg Tablets.  Take 1 tablet daily.    2.  Continue your other medications as prescribed.    3.  Stop in the lab to have your blood drawn.  I will call you if we need to discuss the results.  4.  Follow up in 1 month to see how the Adderall is working for you.

## 2012-05-17 NOTE — Progress Notes (Signed)
Subjective:   Patient ID: Dakota Nicholson male   DOB: January 28, 1985 27 y.o.   MRN: [id number]  HPI: [name] is a 27 y.o. man who presents to clinic for follow up on his chronic medical problems including hypertension and GERD.  He states that he has been taking his blood pressure medication more reliably over the last 2 months after he attempted to donate plasma and was noted to have a BP of 170s/110s.  He denies side effects from the medications.  See Problem focused Assessment and Plan for full details of his chronic medical conditions.   He also states that he has been having problems concentrating as he has been working to finish up his degree.  He notes that he will sometimes have to read the same material over 3-4 times because he "gets distracted."  He also states that he is worrying about things a lot to the point where it is making it difficult for him to fall sleep at night.  He states that he usually tries to go to bed about 1-2 am and then sometimes takes between 30 to 120 minutes to fall asleep.  He will then sleep until 10 am and then wake up feeling refreshed.  He states that once he falls asleep he does not awaken.  He states that he has some racing thoughts but that he is mostly uncomfortable from heat.    Past Medical History  Diagnosis Date  . Allergy   . Hypertension   . GERD (gastroesophageal reflux disease)    Current Outpatient Prescriptions  Medication Sig Dispense Refill  . albuterol (PROAIR HFA) 108 (90 BASE) MCG/ACT inhaler Inhale 2 puffs into the lungs every 6 (six) hours as needed.  1 Inhaler  11  . Calcium Carb-Cholecalciferol 500-600 MG-UNIT TABS Take 500-600 Units by mouth daily.  30 tablet  5  . esomeprazole (NEXIUM) 20 MG capsule Take 1 capsule (20 mg total) by mouth daily.  30 capsule  1  . esomeprazole (NEXIUM) 40 MG capsule Take 1 capsule (40 mg total) by mouth daily before breakfast.  30 capsule  3  . losartan (COZAAR) 50 MG tablet Take  1 tablet (50 mg total) by mouth daily.  30 tablet  2   No current facility-administered medications for this visit.   Family History  Problem Relation Age of Onset  . Diabetes Mother   . Hypertension Mother   . Asthma Brother    History   Social History  . Marital Status: Single    Spouse Name: N/A    Number of Children: N/A  . Years of Education: N/A   Social History Main Topics  . Smoking status: Former Smoker    Quit date: 08/21/2008  . Smokeless tobacco: None  . Alcohol Use: Yes  . Drug Use: None  . Sexually Active: None   Other Topics Concern  . None   Social History Narrative   Holiday representative    Voice Major    No smoking   Occasional ETOH   Review of Systems: A 12 point review of systems was done and is negative except as noted in the HPI.   Objective:  Physical Exam: Filed Vitals:   05/17/12 1431  BP: 132/78  Pulse: 87  Temp: 99 F (37.2 C)  TempSrc: Oral  Height: 6\' 1"  (1.854 m)  Weight: 350 lb 11.2 oz (159.076 kg)  SpO2: 98%   Constitutional: Vital signs reviewed.  Patient is a well-developed and well-nourished  obese young man in no acute distress and cooperative with exam. Alert and oriented x3.  Head: Normocephalic and atraumatic Ear: TM normal bilaterally Mouth: no erythema or exudates, MMM Eyes: PERRL, EOMI, conjunctivae normal, No scleral icterus.  Neck: Supple, Trachea midline normal ROM, No JVD, mass, thyromegaly, or carotid bruit present.  Cardiovascular: RRR, S1 normal, S2 normal, no MRG, pulses symmetric and intact bilaterally Pulmonary/Chest: CTAB, no wheezes, rales, or rhonchi Abdominal: Soft. Non-tender, non-distended, bowel sounds are normal, no masses, organomegaly, or guarding present.  Musculoskeletal: No joint deformities, erythema, or stiffness, ROM full and no nontender Hematology: no cervical, inginal, or axillary adenopathy.  Neurological: A&O x3, Strength is normal and symmetric bilaterally, cranial nerve II-XII  are grossly intact, no focal motor deficit, sensory intact to light touch bilaterally.  Skin: Warm, dry and intact. No rash, cyanosis, or clubbing.  Psychiatric: Normal mood and affect. speech and behavior is normal. Judgment and thought content normal. Cognition and memory are normal.   Assessment & Plan:

## 2012-05-31 ENCOUNTER — Telehealth: Payer: Self-pay | Admitting: *Deleted

## 2012-05-31 NOTE — Telephone Encounter (Signed)
Call to [facility] for Prior Authorization foe Generic Atteral.  Forms to faxed to Dr. Tonny Branch for completion. Case # [id number].   Angelina Ok, RN 05/31/2012 11:53 AM

## 2012-05-31 NOTE — Telephone Encounter (Signed)
Form completed.  He has a history of non-response to Concerta but we have no documentation of this.  He was also tried on Vyvanse in the past with no response as well.  He states that he took Adderall as a young man and that it worked.

## 2012-06-04 NOTE — Assessment & Plan Note (Signed)
BP Readings from Last 3 Encounters:  05/17/12 132/78  10/13/11 145/77  02/04/11 161/89    Lab Results  Component Value Date   NA 141 05/17/2012   K 4.2 05/17/2012   CREATININE 1.03 05/17/2012    Assessment: Blood pressure control: controlled Progress toward BP goal:  at goal Comments: Blood pressure is well controlled today.  He states that he has been recently taking his medications as prescribed.  He denies headaches, changes in his vision, chest pain, or dizziness on standing.   Plan: Medications:  continue current medications Educational resources provided: brochure;handout;video Self management tools provided:   Other plans: I encouraged Dakota Nicholson to continue taking his medications as prescribed.  We checked his electrolytes and creatinine today and those were within normal limits.

## 2012-06-04 NOTE — Assessment & Plan Note (Signed)
His symptoms, while it is possible that they are a manifestation of his ADD, are also highly suggestive of depression with anxiety.  He has a history of mental illness with psychotic features in 2011.  He is hesitant to start treatment for depression and anxiety at this time and attributes his symptoms to his ADD.  We will treat with a trial of Adderall and if this is unsuccessful we will discontinue it and start an SSRI.

## 2012-06-04 NOTE — Assessment & Plan Note (Addendum)
He has a history as a child of ADHD and was tried on several medications per his and his mother's report including Concerta and Ritalin.  He states that neither of these medications worked for him and he was then changed to Adderall which worked well.  He states that he was able to get off the medication during his adolescence.  More recently he has noticed the symptoms described in the HPI and would like to try the Adderall again to see if it would work well for him.  Differential diagnosis for his insomnia, racing thoughts, and decreased concentration includes ADD vs anxiety/depression which he also has a history of.  We discussed treatment of each and we will try with the Adderall as a first step and if these symptoms do not improve we will discontinue it and start SSRI therapy for the anxiety and depression.  We did discuss that we will need to watch his blood pressure closely to ensure that it does not increase with the addition of the Adderall.  We did discuss that Adderall is a controlled substance and that if this becomes a long term medication that it would require random drug screening as well as the controlled substance contract.

## 2012-06-04 NOTE — Assessment & Plan Note (Signed)
He has problems with initiating sleep as well as maintaining adequate sleep.  We discussed sleep hygiene today including setting a regular bedtime and wake time as well as limiting distractions while trying to sleep.  We will start with this for now and if this does not improve we will consider a sleep medication.

## 2012-06-04 NOTE — Assessment & Plan Note (Addendum)
He states that while he is taking his nexium he is symptom free from his GERD.  He takes it on average every other day which keeps him asymptomatic.  Refilled his Nexium today.

## 2012-06-07 NOTE — Progress Notes (Signed)
Case discussed with Dr. Pribula soon after the resident saw the patient.  We reviewed the resident's history and exam and pertinent patient test results.  I agree with the assessment, diagnosis and plan of care documented in the resident's note. 

## 2012-06-14 ENCOUNTER — Ambulatory Visit: Payer: BC Managed Care – PPO | Admitting: Internal Medicine

## 2012-07-14 NOTE — Assessment & Plan Note (Signed)
We discussed his weight today and the need to work on getting his weight down.  We discussed several ways to do this.  WE will have him keep a food diary for now and track what he eats and the portion sizes.  His goal weight loss should be about 15-20 lbs when we see him back in 3 month.

## 2012-07-14 NOTE — Assessment & Plan Note (Signed)
He states that he is missing at least every other day for his blood pressure medications.    BP Readings from Last 3 Encounters:  10/13/11 145/77  02/04/11 161/89  02/03/11 140/79   Blood pressure today is mildly better then last time but still above his goal of <140/90.  I stressed today making sure that he takes his medication every day as prescribed.

## 2012-07-14 NOTE — Assessment & Plan Note (Addendum)
He has a history of GERD and has been taking his nexium PRN.  HE denies abdominal pain, diarrhea, or a sou taste in the back of his throat.  We will refill his nexium today.

## 2012-08-19 ENCOUNTER — Ambulatory Visit (INDEPENDENT_AMBULATORY_CARE_PROVIDER_SITE_OTHER): Payer: BC Managed Care – PPO | Admitting: Internal Medicine

## 2012-08-19 ENCOUNTER — Encounter: Payer: Self-pay | Admitting: Internal Medicine

## 2012-08-19 VITALS — BP 131/72 | HR 61 | Temp 97.6°F | Ht 74.0 in | Wt 353.7 lb

## 2012-08-19 DIAGNOSIS — G4700 Insomnia, unspecified: Secondary | ICD-10-CM

## 2012-08-19 DIAGNOSIS — I10 Essential (primary) hypertension: Secondary | ICD-10-CM

## 2012-08-19 DIAGNOSIS — K219 Gastro-esophageal reflux disease without esophagitis: Secondary | ICD-10-CM

## 2012-08-19 DIAGNOSIS — F988 Other specified behavioral and emotional disorders with onset usually occurring in childhood and adolescence: Secondary | ICD-10-CM

## 2012-08-19 MED ORDER — AMPHETAMINE-DEXTROAMPHET ER 20 MG PO CP24: 20.0000 mg | ORAL_CAPSULE | ORAL | Status: DC

## 2012-08-19 MED ORDER — ESOMEPRAZOLE MAGNESIUM 40 MG PO CPDR: 40.0000 mg | DELAYED_RELEASE_CAPSULE | Freq: Every day | ORAL | Status: DC

## 2012-08-19 MED ORDER — AMPHETAMINE-DEXTROAMPHET ER 10 MG PO CP24: 20.0000 mg | ORAL_CAPSULE | ORAL | Status: DC

## 2012-08-19 NOTE — Progress Notes (Signed)
Patient ID: Rudy Luhmann, male   DOB: 1985-01-30, 27 y.o.   MRN: [id number]  HPI: Mr.Rolly Terrial Rhodes Wiedeman is a 27 y.o. with past medical history of hypertension, GERD, and ADHD, insomnia, and obesity presents to the clinic for followup visit after starting Addrerall in May 2014.   He reports that his symptoms of anxiety, and racing thoughts, have significantly improved since starting Adderall. His insomnia has also improved and is now able to sleep up to 7 hours a day. He requests that I increase his Addrerrall since he feels its effect wears off up to 4 hours. He has completed all of his university courses and is looking forward to applying for jobs.  He reports good compliance with his losartan. I have spent at least 10 minutes counseling the patient about the effect of obesity on his overall general health including cardiorespiratory disease, his GERD, OSA, and treatment for his blood pressure. Patient is motivated to start exercises and other lifestyle changes.    Past Medical History  Diagnosis Date  . Allergy   . Hypertension   . GERD (gastroesophageal reflux disease)    Current Outpatient Prescriptions  Medication Sig Dispense Refill  . albuterol (PROAIR HFA) 108 (90 BASE) MCG/ACT inhaler Inhale 2 puffs into the lungs every 6 (six) hours as needed.  1 Inhaler  11  . amphetamine-dextroamphetamine (ADDERALL XR) 20 MG 24 hr capsule Take 1 capsule (20 mg total) by mouth every morning.  30 capsule  0  . Calcium Carb-Cholecalciferol 500-600 MG-UNIT TABS Take 500-600 Units by mouth daily.  30 tablet  5  . esomeprazole (NEXIUM) 40 MG capsule Take 1 capsule (40 mg total) by mouth daily before breakfast.  30 capsule  3  . losartan (COZAAR) 50 MG tablet Take 1 tablet (50 mg total) by mouth daily.  30 tablet  2   No current facility-administered medications for this visit.   Family History  Problem Relation Age of Onset  . Diabetes Mother   . Hypertension Mother   . Asthma Brother     History   Social History  . Marital Status: Single    Spouse Name: N/A    Number of Children: N/A  . Years of Education: N/A   Social History Main Topics  . Smoking status: Current Some Day Smoker    Types: Cigarettes    Last Attempt to Quit: 08/21/2008  . Smokeless tobacco: None  . Alcohol Use: Yes  . Drug Use: None  . Sexually Active: None   Other Topics Concern  . None   Social History Narrative   Holiday representative    Voice Major    No smoking   Occasional ETOH    Review of Systems: Constitutional: Denies fever, chills, diaphoresis, appetite change and fatigue.  Respiratory: Denies SOB, DOE, cough, chest tightness, and wheezing.  Cardiovascular: No chest pain, palpitations and leg swelling.  Gastrointestinal: No abdominal pain, nausea, vomiting, bloody stools Genitourinary: No dysuria, frequency, hematuria, or flank pain.  Musculoskeletal: No myalgias, back pain, joint swelling, arthralgias    Objective:  Physical Exam: Filed Vitals:   08/19/12 0902  BP: 131/72  Pulse: 61  Temp: 97.6 F (36.4 C)  TempSrc: Oral  Height: 6\' 2"  (1.88 m)  Weight: 353 lb 11.2 oz (160.437 kg)  SpO2: 99%   General: Obese Well nourished. No acute distress.  Lungs: CTA bilaterally. Heart: RRR; no extra sounds or murmurs  Abdomen: Non-distended, normal BS, soft, nontender; no hepatosplenomegaly  Extremities: No pedal  edema. No joint swelling or tenderness. Neurologic: Alert and oriented x3. No obvious neurologic deficits.  Assessment & Plan:  I have discussed my assessment and plan  with Dr. Rogelia Boga as detailed under problem based charting.

## 2012-08-19 NOTE — Progress Notes (Signed)
Case discussed with Dr. Kennerly at the time of the visit.  We reviewed the resident's history and exam and pertinent patient test results.  I agree with the assessment, diagnosis, and plan of care documented in the resident's note. 

## 2012-08-19 NOTE — Assessment & Plan Note (Signed)
BP Readings from Last 3 Encounters:  08/19/12 131/72  05/17/12 132/78  10/13/11 145/77    Lab Results  Component Value Date   NA 141 05/17/2012   K 4.2 05/17/2012   CREATININE 1.03 05/17/2012    Assessment: Blood pressure control: controlled Progress toward BP goal:  at goal Comments:   Plan: Medications:  continue current medications Educational resources provided: brochure Self management tools provided:   Other plans: follow up in 1 month.

## 2012-08-19 NOTE — Assessment & Plan Note (Addendum)
I have increased the dose of Addrerall to 20 mg daily. Patient will be evaluated after one month. This medication can increase his blood pressure and this is the issue that we'll probably need to be addressed when he comes back to make sure that his blood pressure is not significantly elevated. He has failed other alternative medications as per his account and his parents. He continues to express some concerns about affordability for this medication. I have reassured him that if the preauthorization is required I will assist him complete all necessary paperwork.

## 2012-08-19 NOTE — Patient Instructions (Addendum)
I have increased your dose of Adderall to 20 mg once daily  Please come back in 1 month for follow up of symptoms and High blood pressure  I will try to see if I can pre-authorize your medications from your insurance  Please try to lose some weight  Please keep active Have a nice day.    Treatment Goals:  Goals (1 Years of Data) as of 08/19/12         As of Today 05/17/12 10/13/11 02/04/11     Blood Pressure    . Blood Pressure < 140/90  131/72 132/78 145/77 161/89      Progress Toward Treatment Goals:  Treatment Goal 08/19/2012  Blood pressure at goal    Self Care Goals & Plans:  Self Care Goal 08/19/2012  Manage my medications take my medicines as prescribed; refill my medications on time  Monitor my health keep track of my weight  Eat healthy foods eat foods that are low in salt; eat baked foods instead of fried foods  Be physically active find time in my schedule; park at the far end of the parking lot  Stop smoking go to the [employer] website ([employer]); cut down the number of cigarettes smoked       Care Management & Community Referrals:  Referral 05/17/2012  Referrals made for care management support none needed

## 2012-09-26 ENCOUNTER — Other Ambulatory Visit: Payer: Self-pay | Admitting: *Deleted

## 2012-09-26 ENCOUNTER — Encounter: Payer: Self-pay | Admitting: Internal Medicine

## 2012-09-26 DIAGNOSIS — F988 Other specified behavioral and emotional disorders with onset usually occurring in childhood and adolescence: Secondary | ICD-10-CM

## 2012-09-26 MED ORDER — AMPHETAMINE-DEXTROAMPHET ER 20 MG PO CP24: 20.0000 mg | ORAL_CAPSULE | ORAL | Status: DC

## 2012-09-26 NOTE — Telephone Encounter (Signed)
Requesting more than 1 refill.  Thanks

## 2012-09-26 NOTE — Telephone Encounter (Signed)
Dakota Nicholson will pick up.

## 2012-09-26 NOTE — Progress Notes (Signed)
This encounter was created in error - please disregard.

## 2012-09-30 ENCOUNTER — Encounter: Payer: Self-pay | Admitting: Internal Medicine

## 2012-09-30 ENCOUNTER — Ambulatory Visit (INDEPENDENT_AMBULATORY_CARE_PROVIDER_SITE_OTHER): Payer: BC Managed Care – PPO | Admitting: Internal Medicine

## 2012-09-30 VITALS — BP 136/82 | HR 78 | Temp 96.9°F | Ht 74.0 in | Wt 338.5 lb

## 2012-09-30 DIAGNOSIS — G4700 Insomnia, unspecified: Secondary | ICD-10-CM

## 2012-09-30 DIAGNOSIS — I10 Essential (primary) hypertension: Secondary | ICD-10-CM

## 2012-09-30 DIAGNOSIS — R7989 Other specified abnormal findings of blood chemistry: Secondary | ICD-10-CM

## 2012-09-30 DIAGNOSIS — F988 Other specified behavioral and emotional disorders with onset usually occurring in childhood and adolescence: Secondary | ICD-10-CM

## 2012-09-30 DIAGNOSIS — Z6841 Body Mass Index (BMI) 40.0 and over, adult: Secondary | ICD-10-CM

## 2012-09-30 MED ORDER — LOSARTAN POTASSIUM 50 MG PO TABS: 50.0000 mg | ORAL_TABLET | Freq: Every day | ORAL | Status: DC

## 2012-09-30 NOTE — Assessment & Plan Note (Signed)
Insomnia is improving despite Adderall use.  He currently sleeps 6-7 hours per night.  No sleep medication needed at present.

## 2012-09-30 NOTE — Patient Instructions (Addendum)
General Instructions: 1. Keep up the good work.  Please return to clinic in 3-4 months for follow-up/blood pressure check.  2. Please take all medications as prescribed.  3. If you have worsening of your symptoms or new symptoms arise, please call the clinic ([phone]), or go to the ER immediately if symptoms are severe.   Treatment Goals:  Goals (1 Years of Data) as of 09/30/12         As of Today 08/19/12 05/17/12 10/13/11 02/04/11     Blood Pressure    . Blood Pressure < 140/90  136/82 131/72 132/78 145/77 161/89      Progress Toward Treatment Goals:  Treatment Goal 09/30/2012  Blood pressure at goal    Self Care Goals & Plans:  Self Care Goal 09/30/2012  Manage my medications take my medicines as prescribed; bring my medications to every visit; refill my medications on time  Monitor my health -  Eat healthy foods drink diet soda or water instead of juice or soda; eat more vegetables; eat foods that are low in salt; eat baked foods instead of fried foods  Be physically active find an activity I enjoy; take a walk every day  Stop smoking -  Meeting treatment goals maintain the current self-care plan     Care Management & Community Referrals:  Referral 05/17/2012  Referrals made for care management support none needed

## 2012-09-30 NOTE — Assessment & Plan Note (Signed)
Doing well on Adderall 20mg  daily.  Will continue this dose.

## 2012-09-30 NOTE — Progress Notes (Signed)
I saw and evaluated the patient.  I personally confirmed the key portions of the history and exam documented by Dr. Andrey Campanile and I reviewed pertinent patient test results.  The assessment, diagnosis, and plan were formulated together and I agree with the documentation in the resident's note. He has been out of his Adderall for several days. He has the scripts in his car - all three.

## 2012-09-30 NOTE — Assessment & Plan Note (Addendum)
He is down 15 pounds since visit 2.5 months ago.  BMI now 43.6 (improved from 45.5).  He eats a balanced diet and drinks plenty of water.  Patient plans to begin a walking regimen to aid in continued weight loss.

## 2012-09-30 NOTE — Progress Notes (Signed)
  Subjective:    Patient ID: Dakota Nicholson, male    DOB: May 27, 1985, 27 y.o.   MRN: [id number]  HPI Comments: [name] is a 27 year old male with a PMH of ADHD, HTN, depression/anxiety, insomnia, GERD and obesity.  He was recently started on Adderall in May 2014.  He is here for follow-up after Adderall was increased to 20mg  in August 2014.  He says that his symptoms are well controlled with the dose increase.  He denies inattention, impulsiveness, restlessness.  He is able to function without difficulty.  He denies headache or change in sleep.  His insomnia is actually improving and he reports 6-7 hours of sleep nightly.  His twice weekly BP checks are usually 130s/80s since the medication increase.  His BP today is 136/82.  He also has some nasal congestion that started four days ago followed by cough, fever and sore throat.  He still has some congestion but his other symptoms have improved and his afebrile.       Review of Systems  Constitutional: Positive for fatigue. Negative for fever.  HENT: Negative for hearing loss.   Eyes: Negative for visual disturbance.  Respiratory: Negative for shortness of breath.   Cardiovascular: Negative for chest pain.  Gastrointestinal: Negative for abdominal pain, diarrhea and constipation.  Neurological: Negative for weakness and numbness.       Objective:   Physical Exam  Constitutional: He is oriented to person, place, and time. He appears well-nourished. No distress.  HENT:  Head: Normocephalic and atraumatic.  Mouth/Throat: Oropharynx is clear and moist. No oropharyngeal exudate.  No sinus tenderness  Eyes: Pupils are equal, round, and reactive to light. No scleral icterus.  Neck: Neck supple.  Cardiovascular: Normal rate, regular rhythm and normal heart sounds.   No murmur heard. Pulmonary/Chest: Effort normal and breath sounds normal. No respiratory distress. He has no wheezes. He has no rales.  Abdominal: Soft. Bowel sounds are  normal. He exhibits no distension. There is no tenderness.  Musculoskeletal: He exhibits no tenderness.  Neurological: He is alert and oriented to person, place, and time.  Skin: Skin is warm. He is not diaphoretic.  Psychiatric: He has a normal mood and affect. His behavior is normal.          Assessment & Plan:  Please see problem based assessment and plan.

## 2012-09-30 NOTE — Assessment & Plan Note (Signed)
Slight elevation in ALT in 05/2012.  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  Patient donates plasma and has likely already been tested for hepatitis.  Will obtain consent to send for those laboratory records to rule out infectious cause.

## 2012-09-30 NOTE — Assessment & Plan Note (Addendum)
Assessment:  BP 136/82, at goal.    Plan:   1) Continue Cozaar 50mg  daily.  2) Return to clinic in 3-4 months for follow-up

## 2012-12-16 ENCOUNTER — Other Ambulatory Visit: Payer: Self-pay | Admitting: *Deleted

## 2012-12-16 DIAGNOSIS — F988 Other specified behavioral and emotional disorders with onset usually occurring in childhood and adolescence: Secondary | ICD-10-CM

## 2012-12-16 NOTE — Telephone Encounter (Signed)
Call pt when ready. @  [phone] Due on 12/11

## 2012-12-18 MED ORDER — AMPHETAMINE-DEXTROAMPHET ER 20 MG PO CP24: 20.0000 mg | ORAL_CAPSULE | ORAL | Status: DC

## 2012-12-23 ENCOUNTER — Telehealth: Payer: Self-pay | Admitting: *Deleted

## 2012-12-23 NOTE — Telephone Encounter (Signed)
Per dr Midwife, [facility] was called, spoke to steve furr, pt may have script filled 12/25/2012, he asked that i make change, i did so and notified pt

## 2012-12-26 ENCOUNTER — Ambulatory Visit: Payer: BC Managed Care – PPO | Admitting: Internal Medicine

## 2013-01-02 ENCOUNTER — Encounter: Payer: BC Managed Care – PPO | Admitting: Internal Medicine

## 2013-01-02 ENCOUNTER — Encounter: Payer: Self-pay | Admitting: Internal Medicine

## 2013-01-17 ENCOUNTER — Other Ambulatory Visit: Payer: Self-pay | Admitting: *Deleted

## 2013-01-17 DIAGNOSIS — F988 Other specified behavioral and emotional disorders with onset usually occurring in childhood and adolescence: Secondary | ICD-10-CM

## 2013-01-20 MED ORDER — AMPHETAMINE-DEXTROAMPHET ER 20 MG PO CP24: 20.0000 mg | ORAL_CAPSULE | ORAL | Status: DC

## 2013-02-14 ENCOUNTER — Other Ambulatory Visit: Payer: Self-pay | Admitting: *Deleted

## 2013-02-14 DIAGNOSIS — F988 Other specified behavioral and emotional disorders with onset usually occurring in childhood and adolescence: Secondary | ICD-10-CM

## 2013-02-17 ENCOUNTER — Other Ambulatory Visit: Payer: Self-pay | Admitting: Internal Medicine

## 2013-02-17 DIAGNOSIS — F988 Other specified behavioral and emotional disorders with onset usually occurring in childhood and adolescence: Secondary | ICD-10-CM

## 2013-02-17 MED ORDER — AMPHETAMINE-DEXTROAMPHET ER 20 MG PO CP24: 20.0000 mg | ORAL_CAPSULE | ORAL | Status: DC

## 2013-02-17 NOTE — Telephone Encounter (Signed)
Rx ready - pt called/informed. 

## 2013-03-06 ENCOUNTER — Encounter: Payer: BC Managed Care – PPO | Admitting: Internal Medicine

## 2013-03-20 ENCOUNTER — Other Ambulatory Visit: Payer: Self-pay | Admitting: *Deleted

## 2013-03-20 DIAGNOSIS — F988 Other specified behavioral and emotional disorders with onset usually occurring in childhood and adolescence: Secondary | ICD-10-CM

## 2013-03-20 NOTE — Telephone Encounter (Signed)
Call when ready @ [phone]

## 2013-03-24 MED ORDER — AMPHETAMINE-DEXTROAMPHET ER 20 MG PO CP24: 20.0000 mg | ORAL_CAPSULE | ORAL | Status: DC

## 2013-03-24 NOTE — Telephone Encounter (Signed)
Pt aware Rx is ready. Stanton KidneyDebra Icker Swigert RN 03/24/13 3:30PM

## 2013-04-25 ENCOUNTER — Encounter: Payer: Self-pay | Admitting: *Deleted

## 2013-04-28 ENCOUNTER — Other Ambulatory Visit: Payer: Self-pay | Admitting: *Deleted

## 2013-04-28 DIAGNOSIS — F988 Other specified behavioral and emotional disorders with onset usually occurring in childhood and adolescence: Secondary | ICD-10-CM

## 2013-04-29 MED ORDER — AMPHETAMINE-DEXTROAMPHET ER 20 MG PO CP24: 20.0000 mg | ORAL_CAPSULE | ORAL | Status: DC

## 2013-04-29 NOTE — Telephone Encounter (Signed)
Pt aware Rx is ready. 

## 2013-05-16 ENCOUNTER — Encounter: Payer: Self-pay | Admitting: Internal Medicine

## 2013-05-16 ENCOUNTER — Ambulatory Visit: Payer: BC Managed Care – PPO | Admitting: Internal Medicine

## 2013-05-29 ENCOUNTER — Encounter: Payer: BC Managed Care – PPO | Admitting: Internal Medicine

## 2013-06-12 ENCOUNTER — Ambulatory Visit (INDEPENDENT_AMBULATORY_CARE_PROVIDER_SITE_OTHER): Payer: 59 | Admitting: Internal Medicine

## 2013-06-12 ENCOUNTER — Encounter: Payer: Self-pay | Admitting: Internal Medicine

## 2013-06-12 VITALS — BP 156/81 | HR 66 | Temp 98.8°F | Ht 74.4 in | Wt 346.1 lb

## 2013-06-12 DIAGNOSIS — Z720 Tobacco use: Secondary | ICD-10-CM | POA: Insufficient documentation

## 2013-06-12 DIAGNOSIS — F17200 Nicotine dependence, unspecified, uncomplicated: Secondary | ICD-10-CM

## 2013-06-12 DIAGNOSIS — I10 Essential (primary) hypertension: Secondary | ICD-10-CM

## 2013-06-12 DIAGNOSIS — G4730 Sleep apnea, unspecified: Secondary | ICD-10-CM

## 2013-06-12 DIAGNOSIS — F988 Other specified behavioral and emotional disorders with onset usually occurring in childhood and adolescence: Secondary | ICD-10-CM

## 2013-06-12 LAB — LIPID PANEL
Cholesterol: 156 mg/dL (ref 0–200)
HDL: 40 mg/dL (ref >39)
LDL CALC: 91 mg/dL (ref 0–99)
TRIGLYCERIDES: 123 mg/dL (ref <150)
Total CHOL/HDL Ratio: 3.9 Ratio
VLDL: 25 mg/dL (ref 0–40)

## 2013-06-12 LAB — BASIC METABOLIC PANEL
BUN: 10 mg/dL (ref 6–23)
CALCIUM: 9.1 mg/dL (ref 8.4–10.5)
CO2: 28 mEq/L (ref 19–32)
CREATININE: 0.96 mg/dL (ref 0.50–1.35)
Chloride: 103 mEq/L (ref 96–112)
GLUCOSE: 87 mg/dL (ref 70–99)
Potassium: 4.7 mEq/L (ref 3.5–5.3)
Sodium: 139 mEq/L (ref 135–145)

## 2013-06-12 LAB — HEMOGLOBIN A1C
HEMOGLOBIN A1C: 5.7 % — AB (ref <5.7)
Mean Plasma Glucose: 117 mg/dL — ABNORMAL HIGH (ref <117)

## 2013-06-12 MED ORDER — AMPHETAMINE-DEXTROAMPHET ER 20 MG PO CP24: 20.0000 mg | ORAL_CAPSULE | ORAL | Status: DC

## 2013-06-12 MED ORDER — LOSARTAN POTASSIUM-HCTZ 50-12.5 MG PO TABS: 1.0000 | ORAL_TABLET | Freq: Every day | ORAL | Status: DC

## 2013-06-12 NOTE — Assessment & Plan Note (Signed)
Pt is non compliant with CPAP but is motivated to lose weight which would benefit his sleep apnea Will follow up on next visit

## 2013-06-12 NOTE — Assessment & Plan Note (Addendum)
BP Readings from Last 3 Encounters:  06/12/13 156/81  09/30/12 136/82  08/19/12 131/72    Lab Results  Component Value Date   NA 141 05/17/2012   K 4.2 05/17/2012   CREATININE 1.03 05/17/2012    Assessment: Blood pressure control:  fair Progress toward BP goal:   deteriorated Comments: Will need change in BP meds today to better control BP  Plan: Medications:  D/c cozaar and start Hyzaar 50/12.5 mg today Educational resources provided: brochure Self management tools provided: home blood pressure logbook Other plans: patient educated about importance of compliance with BP meds as well as weight loss and smoking cessation. Will check BMET today

## 2013-06-12 NOTE — Assessment & Plan Note (Addendum)
Pt is motivated to lose weight. Information given about diet and weight loss exercises.  Will check HbA1C and lipid profile today

## 2013-06-12 NOTE — Patient Instructions (Signed)
- I am glad you are motivated to lose weight. We will talk about this again on your next visit - Your BP is not at goal. We will change your BP medication to Hyzaar 50/12.5 mg - Please follow up in 1 month to reassess BP - I am happy you are going to quit smoking. If you need further assistance with this please let me know and we can provide some resources for you Exercise to Lose Weight Exercise and a healthy diet may help you lose weight. Your doctor may suggest specific exercises. EXERCISE IDEAS AND TIPS  Choose low-cost things you enjoy doing, such as walking, bicycling, or exercising to workout videos.  Take stairs instead of the elevator.  Walk during your lunch break.  Park your car further away from work or school.  Go to a gym or an exercise class.  Start with 5 to 10 minutes of exercise each day. Build up to 30 minutes of exercise 4 to 6 days a week.  Wear shoes with good support and comfortable clothes.  Stretch before and after working out.  Work out until you breathe harder and your heart beats faster.  Drink extra water when you exercise.  Do not do so much that you hurt yourself, feel dizzy, or get very short of breath. Exercises that burn about 150 calories:  Running 1  miles in 15 minutes.  Playing volleyball for 45 to 60 minutes.  Washing and waxing a car for 45 to 60 minutes.  Playing touch football for 45 minutes.  Walking 1  miles in 35 minutes.  Pushing a stroller 1  miles in 30 minutes.  Playing basketball for 30 minutes.  Raking leaves for 30 minutes.  Bicycling 5 miles in 30 minutes.  Walking 2 miles in 30 minutes.  Dancing for 30 minutes.  Shoveling snow for 15 minutes.  Swimming laps for 20 minutes.  Walking up stairs for 15 minutes.  Bicycling 4 miles in 15 minutes.  Gardening for 30 to 45 minutes.  Jumping rope for 15 minutes.  Washing windows or floors for 45 to 60 minutes. Document Released: 02/04/2010 Document  Revised: 03/27/2011 Document Reviewed: 02/04/2010 [facility] Patient Information 2014 [place], [place]. Calorie Counting Diet A calorie counting diet requires you to eat the number of calories that are right for you in a day. Calories are the measurement of how much energy you get from the food you eat. Eating the right amount of calories is important for staying at a healthy weight. If you eat too many calories, your body will store them as fat and you may gain weight. If you eat too few calories, you may lose weight. Counting the number of calories you eat during a day will help you know if you are eating the right amount. A Registered Dietitian can determine how many calories you need in a day. The amount of calories needed varies from person to person. If your goal is to lose weight, you will need to eat fewer calories. Losing weight can benefit you if you are overweight or have health problems such as heart disease, high blood pressure, or diabetes. If your goal is to gain weight, you will need to eat more calories. Gaining weight may be necessary if you have a certain health problem that causes your body to need more energy. TIPS Whether you are increasing or decreasing the number of calories you eat during a day, it may be hard to get used to changes in what  you eat and drink. The following are tips to help you keep track of the number of calories you eat.  Measure foods at home with measuring cups. This helps you know the amount of food and number of calories you are eating.  Restaurants often serve food in amounts that are larger than 1 serving. While eating out, estimate how many servings of a food you are given. For example, a serving of cooked rice is  cup or about the size of half of a fist. Knowing serving sizes will help you be aware of how much food you are eating at restaurants.  Ask for smaller portion sizes or child-size portions at restaurants.  Plan to eat half of a meal at a  restaurant. Take the rest home or share the other half with a friend.  Read the Nutrition Facts panel on food labels for calorie content and serving size. You can find out how many servings are in a package, the size of a serving, and the number of calories each serving has.  For example, a package might contain 3 cookies. The Nutrition Facts panel on that package says that 1 serving is 1 cookie. Below that, it will say there are 3 servings in the container. The calories section of the Nutrition Facts label says there are 90 calories. This means there are 90 calories in 1 cookie (1 serving). If you eat 1 cookie you have eaten 90 calories. If you eat all 3 cookies, you have eaten 270 calories (3 servings x 90 calories = 270 calories). The list below tells you how big or small some common portion sizes are.  1 oz.........4 stacked dice.  3 oz........Marland KitchenDeck of cards.  1 tsp.......Marland KitchenTip of little finger.  1 tbs......Marland KitchenMarland KitchenThumb.  2 tbs.......Marland KitchenGolf ball.   cup......Marland KitchenHalf of a fist.  1 cup.......Marland KitchenA fist. KEEP A FOOD LOG Write down every food item you eat, the amount you eat, and the number of calories in each food you eat during the day. At the end of the day, you can add up the total number of calories you have eaten. It may help to keep a list like the one below. Find out the calorie information by reading the Nutrition Facts panel on food labels. Breakfast  Bran cereal (1 cup, 110 calories).  Fat-free milk ( cup, 45 calories). Snack  Apple (1 medium, 80 calories). Lunch  Spinach (1 cup, 20 calories).  Tomato ( medium, 20 calories).  Chicken breast strips (3 oz, 165 calories).  Shredded cheddar cheese ( cup, 110 calories).  Light [place] dressing (2 tbs, 60 calories).  Whole-wheat bread (1 slice, 80 calories).  Tub margarine (1 tsp, 35 calories).  Vegetable soup (1 cup, 160 calories). Dinner  Pork chop (3 oz, 190 calories).  Brown rice (1 cup, 215 calories).  Steamed  broccoli ( cup, 20 calories).  Strawberries (1  cup, 65 calories).  Whipped cream (1 tbs, 50 calories). Daily Calorie Total: 1425 Document Released: 01/02/2005 Document Revised: 03/27/2011 Document Reviewed: 06/29/2006 [facility] Patient Information 2014 [place], [place]. Smoking Cessation Quitting smoking is important to your health and has many advantages. However, it is not always easy to quit since nicotine is a very addictive drug. Often times, people try 3 times or more before being able to quit. This document explains the best ways for you to prepare to quit smoking. Quitting takes hard work and a lot of effort, but you can do it. ADVANTAGES OF QUITTING SMOKING  You will live longer, feel better,  and live better.  Your body will feel the impact of quitting smoking almost immediately.  Within 20 minutes, blood pressure decreases. Your pulse returns to its normal level.  After 8 hours, carbon monoxide levels in the blood return to normal. Your oxygen level increases.  After 24 hours, the chance of having a heart attack starts to decrease. Your breath, hair, and body stop smelling like smoke.  After 48 hours, damaged nerve endings begin to recover. Your sense of taste and smell improve.  After 72 hours, the body is virtually free of nicotine. Your bronchial tubes relax and breathing becomes easier.  After 2 to 12 weeks, lungs can hold more air. Exercise becomes easier and circulation improves.  The risk of having a heart attack, stroke, cancer, or lung disease is greatly reduced.  After 1 year, the risk of coronary heart disease is cut in half.  After 5 years, the risk of stroke falls to the same as a nonsmoker.  After 10 years, the risk of lung cancer is cut in half and the risk of other cancers decreases significantly.  After 15 years, the risk of coronary heart disease drops, usually to the level of a nonsmoker.  If you are pregnant, quitting smoking will improve your  chances of having a healthy baby.  The people you live with, especially any children, will be healthier.  You will have extra money to spend on things other than cigarettes. QUESTIONS TO THINK ABOUT BEFORE ATTEMPTING TO QUIT You may want to talk about your answers with your caregiver.  Why do you want to quit?  If you tried to quit in the past, what helped and what did not?  What will be the most difficult situations for you after you quit? How will you plan to handle them?  Who can help you through the tough times? Your family? Friends? A caregiver?  What pleasures do you get from smoking? What ways can you still get pleasure if you quit? Here are some questions to ask your caregiver:  How can you help me to be successful at quitting?  What medicine do you think would be best for me and how should I take it?  What should I do if I need more help?  What is smoking withdrawal like? How can I get information on withdrawal? GET READY  Set a quit date.  Change your environment by getting rid of all cigarettes, ashtrays, matches, and lighters in your home, car, or work. Do not let people smoke in your home.  Review your past attempts to quit. Think about what worked and what did not. GET SUPPORT AND ENCOURAGEMENT You have a better chance of being successful if you have help. You can get support in many ways.  Tell your family, friends, and co-workers that you are going to quit and need their support. Ask them not to smoke around you.  Get individual, group, or telephone counseling and support. Programs are available at [employer] and health centers. Call your local health department for information about programs in your area.  Spiritual beliefs and practices may help some smokers quit.  Download a "quit meter" on your computer to keep track of quit statistics, such as how long you have gone without smoking, cigarettes not smoked, and money saved.  Get a self-help book  about quitting smoking and staying off of tobacco. LEARN NEW SKILLS AND BEHAVIORS  Distract yourself from urges to smoke. Talk to someone, go for a walk, or  occupy your time with a task.  Change your normal routine. Take a different route to work. Drink tea instead of coffee. Eat breakfast in a different place.  Reduce your stress. Take a hot bath, exercise, or read a book.  Plan something enjoyable to do every day. Reward yourself for not smoking.  Explore interactive web-based programs that specialize in helping you quit. GET MEDICINE AND USE IT CORRECTLY Medicines can help you stop smoking and decrease the urge to smoke. Combining medicine with the above behavioral methods and support can greatly increase your chances of successfully quitting smoking.  Nicotine replacement therapy helps deliver nicotine to your body without the negative effects and risks of smoking. Nicotine replacement therapy includes nicotine gum, lozenges, inhalers, nasal sprays, and skin patches. Some may be available over-the-counter and others require a prescription.  Antidepressant medicine helps people abstain from smoking, but how this works is unknown. This medicine is available by prescription.  Nicotinic receptor partial agonist medicine simulates the effect of nicotine in your brain. This medicine is available by prescription. Ask your caregiver for advice about which medicines to use and how to use them based on your health history. Your caregiver will tell you what side effects to look out for if you choose to be on a medicine or therapy. Carefully read the information on the package. Do not use any other product containing nicotine while using a nicotine replacement product.  RELAPSE OR DIFFICULT SITUATIONS Most relapses occur within the first 3 months after quitting. Do not be discouraged if you start smoking again. Remember, most people try several times before finally quitting. You may have symptoms of  withdrawal because your body is used to nicotine. You may crave cigarettes, be irritable, feel very hungry, cough often, get headaches, or have difficulty concentrating. The withdrawal symptoms are only temporary. They are strongest when you first quit, but they will go away within 10 14 days. To reduce the chances of relapse, try to:  Avoid drinking alcohol. Drinking lowers your chances of successfully quitting.  Reduce the amount of caffeine you consume. Once you quit smoking, the amount of caffeine in your body increases and can give you symptoms, such as a rapid heartbeat, sweating, and anxiety.  Avoid smokers because they can make you want to smoke.  Do not let weight gain distract you. Many smokers will gain weight when they quit, usually less than 10 pounds. Eat a healthy diet and stay active. You can always lose the weight gained after you quit.  Find ways to improve your mood other than smoking. FOR MORE INFORMATION  www.smokefree.gov  Document Released: 12/27/2000 Document Revised: 07/04/2011 Document Reviewed: 04/13/2011 [facility] Patient Information 2014 [place], [place].

## 2013-06-12 NOTE — Assessment & Plan Note (Signed)
Continue with current dose of adderal. Prescription given to patient Symptoms are well controlled
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
## 2013-06-18 ENCOUNTER — Encounter: Payer: Self-pay | Admitting: Internal Medicine

## 2013-06-18 ENCOUNTER — Ambulatory Visit (INDEPENDENT_AMBULATORY_CARE_PROVIDER_SITE_OTHER): Payer: 59 | Admitting: Internal Medicine

## 2013-06-18 VITALS — BP 153/95 | HR 74 | Temp 99.2°F | Resp 20 | Ht 74.0 in | Wt 350.2 lb

## 2013-06-18 DIAGNOSIS — I10 Essential (primary) hypertension: Secondary | ICD-10-CM

## 2013-06-18 LAB — BASIC METABOLIC PANEL WITH GFR
BUN: 7 mg/dL (ref 6–23)
CHLORIDE: 104 meq/L (ref 96–112)
CO2: 28 meq/L (ref 19–32)
CREATININE: 0.84 mg/dL (ref 0.50–1.35)
Calcium: 9 mg/dL (ref 8.4–10.5)
GFR, Est African American: >89 mL/min
Glucose, Bld: 113 mg/dL — ABNORMAL HIGH (ref 70–99)
POTASSIUM: 4.1 meq/L (ref 3.5–5.3)
Sodium: 140 mEq/L (ref 135–145)

## 2013-06-18 NOTE — Progress Notes (Signed)
Subjective:   Patient ID: Dakota Nicholson male   DOB: 1985/08/08 28 y.o.   MRN: [id number]  HPI: [name] is a 28 y.o. male with ADHD, severe obesity and HTN who presents to opc today for acute visit blood work and HTN follow up.  He was last seen by PCP last week with BP that was noted to be elevated to 156/81 and cozaar was discontinued and started on Hyzaar.  Cr on last bmet last week was wnl 0.96.  Today, initially BP was 153/95. On repeat 149/92.  He did not take his BP medication today, but does recall compliance every day and has noticed increased urination and dry mouth and increased thirst.  We also reviewed his latest lab results. I counseled him on weight control as his weight has continued to increase and cutting back on carbs along with increasing some exercise into his daily routine.  He denies any blurry vision, headaches, chest pain, SOB, or abdominal pain.    Past Medical History  Diagnosis Date  . Allergy   . Hypertension   . GERD (gastroesophageal reflux disease)    Current Outpatient Prescriptions  Medication Sig Dispense Refill  . albuterol (PROAIR HFA) 108 (90 BASE) MCG/ACT inhaler Inhale 2 puffs into the lungs every 6 (six) hours as needed.  1 Inhaler  11  . amphetamine-dextroamphetamine (ADDERALL XR) 20 MG 24 hr capsule Take 1 capsule (20 mg total) by mouth every morning.  30 capsule  0  . Calcium Carb-Cholecalciferol 500-600 MG-UNIT TABS Take 500-600 Units by mouth daily.  30 tablet  5  . esomeprazole (NEXIUM) 40 MG capsule Take 1 capsule (40 mg total) by mouth daily before breakfast.  30 capsule  3  . losartan-hydrochlorothiazide (HYZAAR) 50-12.5 MG per tablet Take 1 tablet by mouth daily.  30 tablet  11   No current facility-administered medications for this visit.   Family History  Problem Relation Age of Onset  . Diabetes Mother   . Hypertension Mother   . Asthma Brother    History   Social History  . Marital Status: Single   Spouse Name: N/A    Number of Children: N/A  . Years of Education: N/A   Social History Main Topics  . Smoking status: Current Every Day Smoker -- 0.10 packs/day    Types: Cigarettes    Last Attempt to Quit: 08/21/2008  . Smokeless tobacco: Not on file     Comment: 1-4 cigs a week  . Alcohol Use: Yes  . Drug Use: Not on file  . Sexual Activity: Not on file   Other Topics Concern  . Not on file   Social History Narrative   [employer]    [employer]    No smoking   Occasional ETOH   Review of Systems:  Constitutional:  Diaphoresis. Denies fever and chills.   HEENT:  Denies congestion, sore throat  Respiratory:  Denies SOB  Cardiovascular:  Denies chest pain  Gastrointestinal:  Denies nausea, vomiting, abdominal pain   Genitourinary:  Frequency.  Denies dysuria  Musculoskeletal:  Denies gait problem.   Skin:  Denies pallor, rash and wound.   Neurological:  Denies seizures, syncope.   Objective:  Physical Exam: Filed Vitals:   06/18/13 1438  BP: 153/95  Pulse: 74  Temp: 99.2 F (37.3 C)  TempSrc: Oral  Resp: 20  Height: 6\' 2"  (1.88 m)  Weight: 350 lb 3.2 oz (158.85 kg)  SpO2: 98%   Vitals reviewed.  General: sitting in chair, NAD, sweating HEENT: EOMI, wears glasses Cardiac: RRR, no rubs, murmurs or gallops Pulm: clear to auscultation bilaterally, no wheezes, rales, or rhonchi Abd: soft, nontender, obese, BS present Ext: warm and well perfused, no pedal edema Neuro: alert and oriented X3, strength and sensation to light touch equal in bilateral upper and lower extremities  Assessment & Plan:  Discussed with Dr. Cyndie ChimeGranfortuna Repeat BP improved to 149/92, continue current dose of hyzaar Recheck 2 weeks bmet today

## 2013-06-18 NOTE — Progress Notes (Signed)
Attending physician: Discussed with resident physician Dr. Virgina Organ. This patient was just noted on blood pressure medication last week but has not been fully compliant. He will be rescheduled for a two-week checkup. Electrolytes today to check potassium. Cephas Darby, MD, FACP  Hematology-Oncology/Internal Medicine

## 2013-06-18 NOTE — Patient Instructions (Addendum)
General Instructions:  Dear Dakota Nicholson,  Thank you for coming in today. Please remember to take your BP medication everyday, especially on the day you come to your visit.  That way, we can have a more accurate idea of what your blood pressure is doing.   Keep drinking water and cut back on soda's and sugary drinks along with try making some of the changes we discussed today with cutting back on carbs, taking one of the bun's or bread piece off a sandwich, and introduce some exercise into your routine. You will start seeing a change in your weight without a lot of exertion.   Your HbA1C was 5.7, which is not diabetic but it does put you at high risk to get diabetes in the future.  Therefore, if we make changes now, we can hopefully try to prevent this.   Please find out if you have been vaccinated for tdap.   Please come back in 2 weeks, we will recheck your BP at that time and may adjust your medication if needed.   Please bring your medicines with you each time you come to clinic.  Medicines may include prescription medications, over-the-counter medications, herbal remedies, eye drops, vitamins, or other pills.   Progress Toward Treatment Goals:  Treatment Goal 06/18/2013  Blood pressure improved    Self Care Goals & Plans:  Self Care Goal 06/18/2013  Manage my medications take my medicines as prescribed; refill my medications on time  Monitor my health keep track of my blood pressure  Eat healthy foods eat baked foods instead of fried foods; eat smaller portions; eat more vegetables; drink diet soda or water instead of juice or soda  Be physically active find an activity I enjoy; take a walk every day  Stop smoking call [facility] (1-800-QUIT-NOW)  Meeting treatment goals -    No flowsheet data found.   Care Management & Community Referrals:  Referral 06/18/2013  Referrals made for care management support -  Referrals made to community resources smoking cessation; weight management;  nutrition    Treatment Goals:  Goals (1 Years of Data) as of 06/18/13         As of Today 06/12/13 09/30/12 08/19/12 05/17/12     Blood Pressure    . Blood Pressure < 140/90  153/95 156/81 136/82 131/72 132/78      Progress Toward Treatment Goals:  Treatment Goal 06/18/2013  Blood pressure improved    Self Care Goals & Plans:  Self Care Goal 06/18/2013  Manage my medications take my medicines as prescribed; refill my medications on time  Monitor my health keep track of my blood pressure  Eat healthy foods eat baked foods instead of fried foods; eat smaller portions; eat more vegetables; drink diet soda or water instead of juice or soda  Be physically active find an activity I enjoy; take a walk every day  Stop smoking call [facility] (1-800-QUIT-NOW)  Meeting treatment goals -    No flowsheet data found.   Care Management & Community Referrals:  Referral 06/18/2013  Referrals made for care management support -  Referrals made to community resources smoking cessation; weight management; nutrition

## 2013-06-18 NOTE — Assessment & Plan Note (Signed)
BP Readings from Last 3 Encounters:  06/18/13 153/95  06/12/13 156/81  09/30/12 136/82   Lab Results  Component Value Date   NA 139 06/12/2013   K 4.7 06/12/2013   CREATININE 0.96 06/12/2013   Assessment: Blood pressure control: mildly elevated Progress toward BP goal:  improved Comments: repeat 149/92 Plan: Medications:  continue current medications hyzaar 50-12.5mg  qd Educational resources provided:   Self management tools provided: home blood pressure logbook Other plans: bmet done today, recheck 2 weeks, if BP remains elevated, consider increasing to hyzaar dose for at least 25mg  of HCTZ

## 2013-06-18 NOTE — Assessment & Plan Note (Signed)
Weight up 4 pounds today since last visit.   Counseled extensively today on diet modifications, limiting carbs, and trying to introduce exercise into daily routine.

## 2013-07-02 ENCOUNTER — Ambulatory Visit (INDEPENDENT_AMBULATORY_CARE_PROVIDER_SITE_OTHER): Payer: 59 | Admitting: Internal Medicine

## 2013-07-02 ENCOUNTER — Encounter: Payer: Self-pay | Admitting: Internal Medicine

## 2013-07-02 VITALS — BP 144/90 | HR 98 | Temp 98.1°F | Ht 73.0 in | Wt 354.0 lb

## 2013-07-02 DIAGNOSIS — F17200 Nicotine dependence, unspecified, uncomplicated: Secondary | ICD-10-CM

## 2013-07-02 DIAGNOSIS — I10 Essential (primary) hypertension: Secondary | ICD-10-CM

## 2013-07-02 DIAGNOSIS — Z720 Tobacco use: Secondary | ICD-10-CM

## 2013-07-02 NOTE — Assessment & Plan Note (Signed)
  Assessment: Progress toward smoking cessation:  stopped smoking Barriers to progress toward smoking cessation:    Comments: stopped x2 weeks both marijuana and cigarettes  Plan: Instruction/counseling given:  I commended patient for quitting and reviewed strategies for preventing relapses. Educational resources provided:    Self management tools provided:    Medications to assist with smoking cessation:  None Patient agreed to the following self-care plans for smoking cessation:    Other plans: does not want NRT at this time.

## 2013-07-02 NOTE — Assessment & Plan Note (Signed)
Continues to gain weight  -counseled on diet and exercise again today. He says he will start trying, maybe work with his brother

## 2013-07-02 NOTE — Progress Notes (Signed)
Attending Physician Note: Presenting problems, physical findings and medications reviewed with resident physician Dr Evert KohlSamaya Quereshi and I concur with her management plan. Cephas DarbyJames Granfortuna, MD, FACP  Hematology-Oncology/Internal Medicine

## 2013-07-02 NOTE — Patient Instructions (Addendum)
General Instructions: Dear Mr. Bethena RoysWright,  Great job on bringing your BP down! We will not change your medication and dose at this time.   Please work on weight loss, try cutting portions as we discussed and introducing exercise into your regimen  Please schedule follow up visit on next available appt with your pcp, Dr. Heide SparkNarendra  Please bring your medicines with you each time you come to clinic.  Medicines may include prescription medications, over-the-counter medications, herbal remedies, eye drops, vitamins, or other pills.  Progress Toward Treatment Goals:  Treatment Goal 07/02/2013  Blood pressure improved  Stop smoking stopped smoking   Self Care Goals & Plans:  Self Care Goal 07/02/2013  Manage my medications take my medicines as prescribed; bring my medications to every visit; refill my medications on time  Monitor my health keep track of my blood pressure  Eat healthy foods drink diet soda or water instead of juice or soda; eat more vegetables; eat foods that are low in salt; eat baked foods instead of fried foods  Be physically active take a walk every day  Stop smoking -  Meeting treatment goals -    No flowsheet data found.  Care Management & Community Referrals:  Referral 06/18/2013  Referrals made for care management support -  Referrals made to community resources smoking cessation; weight management; nutrition

## 2013-07-02 NOTE — Progress Notes (Signed)
   Subjective:   Patient ID: Dakota Nicholson male   DOB: 07/27/1985 27 y.o.   MRN: [id number]  HPI: [name] is a 28 y.o. male with ADHD, severe obesity and HTN who presents to opc today for HTN follow up visit. He did take his bp medications today. Renal function since starting Hyzaar remains stable with Cr <1.  He has been compliant with his medications and BP improved to 144/90 today. He has no complaints denies chest pain, N/V/D, sob, fever, chills, or headache.   Past Medical History  Diagnosis Date  . Allergy   . Hypertension   . GERD (gastroesophageal reflux disease)    Current Outpatient Prescriptions  Medication Sig Dispense Refill  . albuterol (PROAIR HFA) 108 (90 BASE) MCG/ACT inhaler Inhale 2 puffs into the lungs every 6 (six) hours as needed.  1 Inhaler  11  . amphetamine-dextroamphetamine (ADDERALL XR) 20 MG 24 hr capsule Take 1 capsule (20 mg total) by mouth every morning.  30 capsule  0  . Calcium Carb-Cholecalciferol 500-600 MG-UNIT TABS Take 500-600 Units by mouth daily.  30 tablet  5  . esomeprazole (NEXIUM) 40 MG capsule Take 1 capsule (40 mg total) by mouth daily before breakfast.  30 capsule  3  . losartan-hydrochlorothiazide (HYZAAR) 50-12.5 MG per tablet Take 1 tablet by mouth daily.  30 tablet  11   No current facility-administered medications for this visit.   Family History  Problem Relation Age of Onset  . Diabetes Mother   . Hypertension Mother   . Asthma Brother    History   Social History  . Marital Status: Single    Spouse Name: N/A    Number of Children: N/A  . Years of Education: N/A   Social History Main Topics  . Smoking status: Current Every Day Smoker -- 0.10 packs/day    Types: Cigarettes    Last Attempt to Quit: 08/21/2008  . Smokeless tobacco: None     Comment: 1-4 cigs a week  . Alcohol Use: Yes  . Drug Use: None  . Sexual Activity: None   Other Topics Concern  . None   Social History Narrative   [employer]    Voice [employer]    No smoking   Occasional ETOH   Review of Systems:  Constitutional:  Denies fever, chills  HEENT:  Denies congestion  Respiratory:  Denies SOB  Cardiovascular:  Denies chest pain  Gastrointestinal:  Denies nausea, vomiting, abdominal pain.   Genitourinary:  Denies difficulty urinating. Frequency  Musculoskeletal:  Denies myalgias  Skin:  Denies pallor, rash and wound.   Neurological:  Denies dizziness or headaches   Objective:  Physical Exam: Filed Vitals:   07/02/13 1434  BP: 144/90  Pulse: 98  Temp: 98.1 F (36.7 C)  TempSrc: Oral  Height: 6\' 1"  (1.854 m)  Weight: 354 lb (160.573 kg)  SpO2: 98%   Vitals reviewed. General: sitting in chair, NAD HEENT: EOMI Cardiac: RRR, no rubs, murmurs or gallops Pulm: clear to auscultation bilaterally, no wheezes, rales, or rhonchi Abd: soft, obese, nondistended, BS present Ext: warm and well perfused Neuro: alert and oriented X3  Assessment & Plan:  Discussed with Dr. Cyndie ChimeGranfortuna

## 2013-07-02 NOTE — Assessment & Plan Note (Signed)
BP Readings from Last 3 Encounters:  07/02/13 144/90  06/18/13 153/95  06/12/13 156/81   Lab Results  Component Value Date   NA 140 06/18/2013   K 4.1 06/18/2013   CREATININE 0.84 06/18/2013   Assessment: Blood pressure control: mildly elevated Progress toward BP goal:  improved Comments: improved today to 144/90  Plan: Medications:  continue current medications hyzaar 50-12.5mg  qd Educational resources provided:   Self management tools provided:   Other plans: recheck with pcp next visit, if still elevated, consider increasing dose of hyzaar vs. Making 2 pills and increasing hctz to 25mg 

## 2013-11-18 ENCOUNTER — Other Ambulatory Visit: Payer: Self-pay | Admitting: Internal Medicine

## 2013-11-18 ENCOUNTER — Telehealth: Payer: Self-pay | Admitting: *Deleted

## 2013-11-18 MED ORDER — LISINOPRIL-HYDROCHLOROTHIAZIDE 20-25 MG PO TABS: 1.0000 | ORAL_TABLET | Freq: Every day | ORAL | Status: DC

## 2013-11-18 NOTE — Telephone Encounter (Signed)
I took care of it. Thank you. 

## 2013-11-18 NOTE — Telephone Encounter (Signed)
Did this get resolved?

## 2013-11-18 NOTE — Telephone Encounter (Signed)
Pt is here and would like his script, EPIC will not let me enter the request because it needs the diag code updated

## 2013-11-26 ENCOUNTER — Ambulatory Visit (INDEPENDENT_AMBULATORY_CARE_PROVIDER_SITE_OTHER): Payer: Self-pay | Admitting: Internal Medicine

## 2013-11-26 ENCOUNTER — Encounter: Payer: Self-pay | Admitting: Internal Medicine

## 2013-11-26 VITALS — BP 148/76 | HR 97 | Temp 98.7°F | Ht 74.0 in | Wt 377.8 lb

## 2013-11-26 DIAGNOSIS — Z23 Encounter for immunization: Secondary | ICD-10-CM

## 2013-11-26 DIAGNOSIS — Z0000 Encounter for general adult medical examination without abnormal findings: Secondary | ICD-10-CM

## 2013-11-26 DIAGNOSIS — I10 Essential (primary) hypertension: Secondary | ICD-10-CM

## 2013-11-26 LAB — HEPATIC FUNCTION PANEL
ALK PHOS: 43 U/L (ref 39–117)
ALT: 66 U/L — ABNORMAL HIGH (ref 0–53)
AST: 43 U/L — AB (ref 0–37)
Albumin: 4.3 g/dL (ref 3.5–5.2)
BILIRUBIN DIRECT: 0.1 mg/dL (ref 0.0–0.3)
BILIRUBIN TOTAL: 0.4 mg/dL (ref 0.2–1.2)
Indirect Bilirubin: 0.3 mg/dL (ref 0.2–1.2)
Total Protein: 7.1 g/dL (ref 6.0–8.3)

## 2013-11-26 LAB — BASIC METABOLIC PANEL WITH GFR
BUN: 8 mg/dL (ref 6–23)
CO2: 27 meq/L (ref 19–32)
CREATININE: 1.02 mg/dL (ref 0.50–1.35)
Calcium: 9.5 mg/dL (ref 8.4–10.5)
Chloride: 101 mEq/L (ref 96–112)
GFR, Est African American: >89 mL/min
GFR, Est Non African American: >89 mL/min
Glucose, Bld: 87 mg/dL (ref 70–99)
Potassium: 4.2 mEq/L (ref 3.5–5.3)
SODIUM: 139 meq/L (ref 135–145)

## 2013-11-26 MED ORDER — LISINOPRIL-HYDROCHLOROTHIAZIDE 20-12.5 MG PO TABS: 2.0000 | ORAL_TABLET | Freq: Every day | ORAL | Status: DC

## 2013-11-26 NOTE — Patient Instructions (Addendum)
General Instructions: Log your blood pressure  Increase Lisinopril to 40 mg daily and keep HCTZ 25 mg daily Follow up in 2 weeks to 4 weeks if blood pressure still elevated, otherwise 2 months I will let you know about your labs  Take Care    Please bring your medicines with you each time you come to clinic.  Medicines may include prescription medications, over-the-counter medications, herbal remedies, eye drops, vitamins, or other pills.   Progress Toward Treatment Goals:  Treatment Goal 11/26/2013  Blood pressure unchanged  Stop smoking -    Self Care Goals & Plans:  Self Care Goal 11/26/2013  Manage my medications take my medicines as prescribed; bring my medications to every visit; refill my medications on time; follow the sick day instructions if I am sick  Monitor my health keep track of my blood pressure  Eat healthy foods drink diet soda or water instead of juice or soda; eat more vegetables; eat foods that are low in salt; eat baked foods instead of fried foods; eat fruit for snacks and desserts; eat smaller portions  Be physically active find an activity I enjoy  Stop smoking -  Meeting treatment goals maintain the current self-care plan    No flowsheet data found.   Care Management & Community Referrals:  Referral 11/26/2013  Referrals made for care management support none needed  Referrals made to community resources none         Treatment Goals:  Goals (1 Years of Data) as of 11/26/13          As of Today 07/02/13 06/18/13 06/12/13 09/30/12     Blood Pressure   . Blood Pressure < 140/90  154/90 144/90 153/95 156/81 136/82     Lifestyle   . Quit smoking / using tobacco   Yes         Progress Toward Treatment Goals:  Treatment Goal 11/26/2013  Blood pressure unchanged  Stop smoking -    Self Care Goals & Plans:  Self Care Goal 11/26/2013  Manage my medications take my medicines as prescribed; bring my medications to every visit; refill my  medications on time; follow the sick day instructions if I am sick  Monitor my health keep track of my blood pressure  Eat healthy foods drink diet soda or water instead of juice or soda; eat more vegetables; eat foods that are low in salt; eat baked foods instead of fried foods; eat fruit for snacks and desserts; eat smaller portions  Be physically active find an activity I enjoy  Stop smoking -  Meeting treatment goals maintain the current self-care plan    No flowsheet data found.   Care Management & Community Referrals:  Referral 11/26/2013  Referrals made for care management support none needed  Referrals made to community resources none      DASH Eating Plan DASH stands for "Dietary Approaches to Stop Hypertension." The DASH eating plan is a healthy eating plan that has been shown to reduce high blood pressure (hypertension). Additional health benefits may include reducing the risk of type 2 diabetes mellitus, heart disease, and stroke. The DASH eating plan may also help with weight loss. WHAT DO I NEED TO KNOW ABOUT THE DASH EATING PLAN? For the DASH eating plan, you will follow these general guidelines:  Choose foods with a percent daily value for sodium of less than 5% (as listed on the food label).  Use salt-free seasonings or herbs instead of table salt or sea  salt.  Check with your health care provider or pharmacist before using salt substitutes.  Eat lower-sodium products, often labeled as "lower sodium" or "no salt added."  Eat fresh foods.  Eat more vegetables, fruits, and low-fat dairy products.  Choose whole grains. Look for the word "whole" as the first word in the ingredient list.  Choose fish and skinless chicken or [place]key more often than red meat. Limit fish, poultry, and meat to 6 oz (170 g) each day.  Limit sweets, desserts, sugars, and sugary drinks.  Choose heart-healthy fats.  Limit cheese to 1 oz (28 g) per day.  Eat more home-cooked food and  less restaurant, buffet, and fast food.  Limit fried foods.  Cook foods using methods other than frying.  Limit canned vegetables. If you do use them, rinse them well to decrease the sodium.  When eating at a restaurant, ask that your food be prepared with less salt, or no salt if possible. WHAT FOODS CAN I EAT? Seek help from a dietitian for individual calorie needs. Grains Whole grain or whole wheat bread. Brown rice. Whole grain or whole wheat pasta. Quinoa, bulgur, and whole grain cereals. Low-sodium cereals. Corn or whole wheat flour tortillas. Whole grain cornbread. Whole grain crackers. Low-sodium crackers. Vegetables Fresh or frozen vegetables (raw, steamed, roasted, or grilled). Low-sodium or reduced-sodium tomato and vegetable juices. Low-sodium or reduced-sodium tomato sauce and paste. Low-sodium or reduced-sodium canned vegetables.  Fruits All fresh, canned (in natural juice), or frozen fruits. Meat and Other Protein Products Ground beef (85% or leaner), grass-fed beef, or beef trimmed of fat. Skinless chicken or [place]key. Ground chicken or [place]key. Pork trimmed of fat. All fish and seafood. Eggs. Dried beans, peas, or lentils. Unsalted nuts and seeds. Unsalted canned beans. Dairy Low-fat dairy products, such as skim or 1% milk, 2% or reduced-fat cheeses, low-fat ricotta or cottage cheese, or plain low-fat yogurt. Low-sodium or reduced-sodium cheeses. Fats and Oils Tub margarines without trans fats. Light or reduced-fat mayonnaise and salad dressings (reduced sodium). Avocado. Safflower, olive, or canola oils. Natural peanut or almond butter. Other Unsalted popcorn and pretzels. The items listed above may not be a complete list of recommended foods or beverages. Contact your dietitian for more options. WHAT FOODS ARE NOT RECOMMENDED? Grains White bread. White pasta. White rice. Refined cornbread. Bagels and croissants. Crackers that contain trans fat. Vegetables Creamed or  fried vegetables. Vegetables in a cheese sauce. Regular canned vegetables. Regular canned tomato sauce and paste. Regular tomato and vegetable juices. Fruits Dried fruits. Canned fruit in light or heavy syrup. Fruit juice. Meat and Other Protein Products Fatty cuts of meat. Ribs, chicken wings, bacon, sausage, bologna, salami, chitterlings, fatback, hot dogs, bratwurst, and packaged luncheon meats. Salted nuts and seeds. Canned beans with salt. Dairy Whole or 2% milk, cream, half-and-half, and cream cheese. Whole-fat or sweetened yogurt. Full-fat cheeses or blue cheese. Nondairy creamers and whipped toppings. Processed cheese, cheese spreads, or cheese curds. Condiments Onion and garlic salt, seasoned salt, table salt, and sea salt. Canned and packaged gravies. Worcestershire sauce. Tartar sauce. Barbecue sauce. Teriyaki sauce. Soy sauce, including reduced sodium. Steak sauce. Fish sauce. Oyster sauce. Cocktail sauce. Horseradish. Ketchup and mustard. Meat flavorings and tenderizers. Bouillon cubes. Hot sauce. Tabasco sauce. Marinades. Taco seasonings. Relishes. Fats and Oils Butter, stick margarine, lard, shortening, ghee, and bacon fat. Coconut, palm kernel, or palm oils. Regular salad dressings. Other Pickles and olives. Salted popcorn and pretzels. The items listed above may not be a complete list  of foods and beverages to avoid. Contact your dietitian for more information. WHERE CAN I FIND MORE INFORMATION? National Heart, Lung, and Blood Institute: [employer] Document Released: 12/22/2010 Document Revised: 05/19/2013 Document Reviewed: 11/06/2012 [facility] Patient Information 2015 [place], [place]. This information is not intended to replace advice given to you by your health care provider. Make sure you discuss any questions you have with your health care provider.  Hypertension Hypertension, commonly called high blood pressure, is when the force of  blood pumping through your arteries is too strong. Your arteries are the blood vessels that carry blood from your heart throughout your body. A blood pressure reading consists of a higher number over a lower number, such as 110/72. The higher number (systolic) is the pressure inside your arteries when your heart pumps. The lower number (diastolic) is the pressure inside your arteries when your heart relaxes. Ideally you want your blood pressure below 120/80. Hypertension forces your heart to work harder to pump blood. Your arteries may become narrow or stiff. Having hypertension puts you at risk for heart disease, stroke, and other problems.  RISK FACTORS Some risk factors for high blood pressure are controllable. Others are not.  Risk factors you cannot control include:   Race. You may be at higher risk if you are African American.  Age. Risk increases with age.  Gender. Men are at higher risk than women before age 39 years. After age 25, women are at higher risk than men. Risk factors you can control include:  Not getting enough exercise or physical activity.  Being overweight.  Getting too much fat, sugar, calories, or salt in your diet.  Drinking too much alcohol. SIGNS AND SYMPTOMS Hypertension does not usually cause signs or symptoms. Extremely high blood pressure (hypertensive crisis) may cause headache, anxiety, shortness of breath, and nosebleed. DIAGNOSIS  To check if you have hypertension, your health care provider will measure your blood pressure while you are seated, with your arm held at the level of your heart. It should be measured at least twice using the same arm. Certain conditions can cause a difference in blood pressure between your right and left arms. A blood pressure reading that is higher than normal on one occasion does not mean that you need treatment. If one blood pressure reading is high, ask your health care provider about having it checked again. TREATMENT    Treating high blood pressure includes making lifestyle changes and possibly taking medicine. Living a healthy lifestyle can help lower high blood pressure. You may need to change some of your habits. Lifestyle changes may include:  Following the DASH diet. This diet is high in fruits, vegetables, and whole grains. It is low in salt, red meat, and added sugars.  Getting at least 2 hours of brisk physical activity every week.  Losing weight if necessary.  Not smoking.  Limiting alcoholic beverages.  Learning ways to reduce stress. If lifestyle changes are not enough to get your blood pressure under control, your health care provider may prescribe medicine. You may need to take more than one. Work closely with your health care provider to understand the risks and benefits. HOME CARE INSTRUCTIONS  Have your blood pressure rechecked as directed by your health care provider.   Take medicines only as directed by your health care provider. Follow the directions carefully. Blood pressure medicines must be taken as prescribed. The medicine does not work as well when you skip doses. Skipping doses also puts you at risk for  problems.   Do not smoke.   Monitor your blood pressure at home as directed by your health care provider. SEEK MEDICAL CARE IF:   You think you are having a reaction to medicines taken.  You have recurrent headaches or feel dizzy.  You have swelling in your ankles.  You have trouble with your vision. SEEK IMMEDIATE MEDICAL CARE IF:  You develop a severe headache or confusion.  You have unusual weakness, numbness, or feel faint.  You have severe chest or abdominal pain.  You vomit repeatedly.  You have trouble breathing. MAKE SURE YOU:   Understand these instructions.  Will watch your condition.  Will get help right away if you are not doing well or get worse. Document Released: 01/02/2005 Document Revised: 05/19/2013 Document Reviewed:  10/25/2012 [facility] Patient Information 2015 [place], [place]. This information is not intended to replace advice given to you by your health care provider. Make sure you discuss any questions you have with your health care provider.

## 2013-11-26 NOTE — Assessment & Plan Note (Addendum)
BP Readings from Last 3 Encounters:  11/26/13 154/90  07/02/13 144/90  06/18/13 153/95    Lab Results  Component Value Date   NA 140 06/18/2013   K 4.1 06/18/2013   CREATININE 0.84 06/18/2013    Assessment: Blood pressure control: mildly elevated Progress toward BP goal:  unchanged Comments: still elevated on combo pill 20-25 mg   Plan: Medications:  continue current medications increase Lisinopril to 40 mg , continue HCTZ 25 mg qd (will change combo pill to 20-12.5 take 2 pills daily) Educational resources provided: brochure Self management tools provided: home blood pressure logbook Other plans: check CMET today, log blood pressure, f/u in 2-4 weeks if still elevated otherwise 3 months

## 2013-11-26 NOTE — Progress Notes (Signed)
   Subjective:    Patient ID: Dakota Nicholson, male    DOB: 09/12/1985, 28 y.o.   MRN: [id number]  HPI Comments: 28 y.o with allergies, GERD, HTN, h/o elevated lfts  He presents for f/u for 1. HTN-started on Lisinopril-HCTZ 20-25 mg on 11/18/13.  He has taken every day but missed Sunday.  Will check CMET today.  He does not check BP at home but wil try.   2. Obesity-he is trying to lose weight but it is hard finding a time and he works at [employer] and [employer] at work.     SH -no insurance trying to get established with health insurance.       Review of Systems  Respiratory: Negative for shortness of breath.   Cardiovascular: Negative for chest pain and leg swelling.  Neurological: Negative for headaches.       Objective:   Physical Exam  Constitutional: He is oriented to person, place, and time. He appears well-developed and well-nourished. He is cooperative.  HENT:  Head: Normocephalic and atraumatic.  Mouth/Throat: No oropharyngeal exudate.  Eyes: Conjunctivae are normal. Right eye exhibits no discharge. Left eye exhibits no discharge. No scleral icterus.  Cardiovascular: Normal rate, regular rhythm, S1 normal, S2 normal and normal heart sounds.   No murmur heard. No lower ext edema  Pulmonary/Chest: Effort normal and breath sounds normal.  Musculoskeletal: He exhibits no edema.  Neurological: He is alert and oriented to person, place, and time. Gait normal.  Skin: Skin is warm, dry and intact. No rash noted.  Psychiatric: He has a normal mood and affect. His speech is normal and behavior is normal. Judgment and thought content normal. Cognition and memory are normal.  Nursing note and vitals reviewed.         Assessment & Plan:  F/u in 2-4 weeks, sooner if needed, log BP

## 2013-11-26 NOTE — Progress Notes (Signed)
INTERNAL MEDICINE TEACHING ATTENDING ADDENDUM - Shakeyla Giebler, MD: I reviewed and discussed at the time of visit with the resident Dr. McLean, the patient's medical history, physical examination, diagnosis and results of pertinent tests and treatment and I agree with the patient's care as documented.  

## 2013-11-26 NOTE — Assessment & Plan Note (Signed)
-   Will give flu shot today 

## 2013-11-27 ENCOUNTER — Encounter: Payer: Self-pay | Admitting: Internal Medicine

## 2013-11-27 ENCOUNTER — Ambulatory Visit: Payer: 59

## 2014-09-04 ENCOUNTER — Telehealth: Payer: Self-pay | Admitting: Internal Medicine

## 2014-09-04 ENCOUNTER — Ambulatory Visit (INDEPENDENT_AMBULATORY_CARE_PROVIDER_SITE_OTHER): Payer: Self-pay | Admitting: Internal Medicine

## 2014-09-04 VITALS — BP 147/85 | HR 91 | Temp 98.4°F | Ht 74.0 in | Wt 386.4 lb

## 2014-09-04 DIAGNOSIS — F988 Other specified behavioral and emotional disorders with onset usually occurring in childhood and adolescence: Secondary | ICD-10-CM

## 2014-09-04 DIAGNOSIS — M25531 Pain in right wrist: Secondary | ICD-10-CM

## 2014-09-04 DIAGNOSIS — R7309 Other abnormal glucose: Secondary | ICD-10-CM

## 2014-09-04 DIAGNOSIS — K219 Gastro-esophageal reflux disease without esophagitis: Secondary | ICD-10-CM

## 2014-09-04 DIAGNOSIS — F909 Attention-deficit hyperactivity disorder, unspecified type: Secondary | ICD-10-CM

## 2014-09-04 DIAGNOSIS — Z720 Tobacco use: Secondary | ICD-10-CM

## 2014-09-04 DIAGNOSIS — I10 Essential (primary) hypertension: Secondary | ICD-10-CM

## 2014-09-04 DIAGNOSIS — R739 Hyperglycemia, unspecified: Secondary | ICD-10-CM

## 2014-09-04 LAB — POCT GLYCOSYLATED HEMOGLOBIN (HGB A1C): Hemoglobin A1C: 5.8

## 2014-09-04 LAB — GLUCOSE, CAPILLARY: Glucose-Capillary: 103 mg/dL — ABNORMAL HIGH (ref 65–99)

## 2014-09-04 MED ORDER — LOSARTAN POTASSIUM 25 MG PO TABS: 25.0000 mg | ORAL_TABLET | Freq: Every day | ORAL | Status: DC

## 2014-09-04 MED ORDER — LISINOPRIL 20 MG PO TABS: 20.0000 mg | ORAL_TABLET | Freq: Every day | ORAL | Status: DC

## 2014-09-04 NOTE — Patient Instructions (Addendum)
General Instructions: Thank you for coming in today.  - I have sent in a prescription for Lisinopril 20 mg to your pharmacy. Please take this once a day.  - Please check your BP at home and use the log we gave you today to record the pressures. - Continue to work on diet and exercise. - Come back to clinic in 1 month for follow up.   Treatment Goals:  Goals (1 Years of Data) as of 09/04/14          As of Today 11/26/13 11/26/13 07/02/13     Blood Pressure   . Blood Pressure < 140/90  147/85 148/76 154/90 144/90     Lifestyle   . Quit smoking / using tobacco          Progress Toward Treatment Goals:  Treatment Goal 11/26/2013  Blood pressure unchanged  Stop smoking -   Self Care Goals & Plans:  Self Care Goal 11/26/2013  Manage my medications take my medicines as prescribed; bring my medications to every visit; refill my medications on time; follow the sick day instructions if I am sick  Monitor my health keep track of my blood pressure  Eat healthy foods drink diet soda or water instead of juice or soda; eat more vegetables; eat foods that are low in salt; eat baked foods instead of fried foods; eat fruit for snacks and desserts; eat smaller portions  Be physically active find an activity I enjoy  Stop smoking -  Meeting treatment goals maintain the current self-care plan   Care Management & Community Referrals:  Referral 11/26/2013  Referrals made for care management support none needed  Referrals made to community resources none

## 2014-09-04 NOTE — Telephone Encounter (Signed)
[facility] [place]  Advanced Heart Failure and Transplant  Heart Transplant Clinic  Follow-up Visit    Primary Care Physician: Brodsky, Mark E  Referring Provider: Brett Justin Berman  Date of Transplant: 03/07/2019  Organ(s) Transplanted: heart  Indication for transplant: Dilated Myopathy: Idiopathic  PHS increased risk donor: Yes    ID. 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    Interval History:    The patient was last seen on 05/02/21. At that time issues with pain after urologic procedure.    He continues to deal with pain issue largely from prostate surgery. He still has some bleeding and some tissue come out. He tried different strategies and nothing helped. This is really impacting quality of life. He gets tired and frustrated and does not want to take it out on his family.    ROS:  A complete ROS was performed and is negative except as documented in the HPI.      Allergies:  Patient is allergic to cats [other] and dogs [other].    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/2019    Pericardial effusion    Hypertension    Chronic back pain    At risk for infection transmitted from donor    Acute hepatitis C virus infection    Heart transplanted (CMS-HCC)    Acute UTI    Umbilical hernia without obstruction and without gangrene    COVID-19 virus detected    Acute medial meniscus tear of left knee, sequela    Localized osteoarthritis of left knee     Past Surgical History:   Procedure Laterality Date    CARDIAC DEFIBRILLATOR PLACEMENT       PB ANESTH,SHOULDER JOINT,NOS Right      Family History   Problem Relation Name Age of Onset    Hypertension Other      Other Maternal Grandmother          kidney disease needing HD     Social History     Socioeconomic History    Marital status: Single     Spouse name: Not on file    Number of children: Not on file    Years of education: Not on file    Highest education level: Not on file   Occupational History    Not on file   Tobacco Use    Smoking status: Never    Smokeless tobacco: Never    Tobacco comments:     from friends and relatives    Substance and Sexual Activity    Alcohol use: Not Currently     Comment: Prior heavier use, but completely quit in 2016    Drug use: Yes     Comment: eats edible marijuana for pain and insomnia     Sexual activity: Not on file   Other Topics Concern    Not on file   Social History Narrative      Born in [place], also lived in [place], [place], [place], no travel, worked as a carpenter, occasional cedar, no birds, no hot tubs, worked in construction + possible asbestos exposure      Social Determinants of Health     Financial Resource Strain: Not on file   Food Insecurity: Not on file   Transportation Needs: Not on file   Physical Activity: Not on file   Stress: Not on file   Social Connections: Not on file   Intimate Partner Violence: Not on file   Housing Stability: Not on file     Current Outpatient Medications   Medication Sig    albuterol 108 (90 Base) MCG/ACT inhaler Inhale 2 puffs by mouth every 4 hours as needed for Wheezing or Shortness of Breath.    aspirin 81 MG EC tablet Take 1 tablet (81 mg) by mouth daily.    baclofen (LIORESAL) 10 MG tablet Take 2 tablets (20 mg) by mouth nightly.    Blood Glucose Monitoring Suppl (TRUE METRIX METER) w/Device KIT Use as directed    budesonide-formoterol (SYMBICORT) 160-4.5 MCG/ACT inhaler Inhale 2 puffs by mouth every 12 hours.    bumetanide (BUMEX) 1 MG tablet Take 1 tablet (1 mg) by mouth daily as needed (fluid/weight  gain). Do not take unless instructed by Transplant team.    Calcium Carb-Cholecalciferol 600-10 MG-MCG TABS Take 1 tablet by mouth 2 times daily.    Cetirizine HCl (ZERVIATE) 0.24 % SOLN Place 1 drop into both eyes 2 times daily.    clindamycin (CLEOCIN T) 1 % solution Apply 1 Application. topically 2 times daily. Apply to the red bumps on your face up to two times a day.    controlled substance agreement controlled substance agreement    diclofenac (VOLTAREN) 1 % gel Apply 2 g topically 4 times daily.    docusate sodium (COLACE) 100 MG capsule Take 1 capsule (100 mg) by mouth 2 times daily.    DULoxetine (CYMBALTA) 30 MG CR capsule Take 1 capsule (30 mg) by mouth daily.    famotidine (PEPCID) 20 MG tablet Take 1 tablet (20 mg) by mouth 2 times daily.    fluticasone propionate (FLONASE) 50 MCG/ACT nasal spray Spray 1 spray into each nostril 2 times daily.    gabapentin (NEURONTIN) 300 MG capsule Take 1 capsule (300 mg) by mouth every morning AND 1 capsule (300 mg) daily AND 2 capsules (600 mg) every evening.    hydroCHLOROthiazide (HYDRODIURIL) 25 MG tablet Take 1 tablet (25 mg) by mouth daily.    ketoconazole (NIZORAL) 2 % shampoo Use shampoo daily for dandruff    lidocaine (LIDOCAINE PAIN RELIEF) 4 % patch Apply 1 patch topically every 24 hours. Leave patch on for 12 hours, then remove for 12 hours.    lisinopril (PRINIVIL, ZESTRIL) 10 MG tablet Take 2 tablets (20 mg) by mouth daily.    magnesium oxide (MAG-OX) 400 MG tablet Take 1 tablet by mouth daily    melatonin (GNP MELATONIN MAXIMUM STRENGTH) 5 MG tablet Take 2 tablets (10 mg) by mouth at bedtime.    Multiple Vitamin (MULTIVITAMIN) TABS tablet Take 1 tablet by mouth daily.    naloxone (KLOXXADO) 8 mg/0.1 mL nasal spray Call 911! Tilt head and spray intranasally into one nostril as needed for respiratory depression. If patient does not respond or responds and then relapses, repeat using a new nasal spray every 3 minutes until emergency medical assistance  arrives.    NEEDLE, DISP, 25 G 25G X   1" MISC Use to inject testosterone    NIFEdipine (ADALAT CC) 30 MG Controlled-Release tablet Take 1 tablet (30 mg) by mouth nightly.    ondansetron (ZOFRAN) 8 MG tablet Take 1 tablet (8 mg) by mouth every 8 hours as needed for Nausea/Vomiting.    oxyCODONE (ROXICODONE) 10 MG tablet Take 1 tab every 4 hours as needed for moderate pain and 2 tabs every 4 hours as needed for severe pain. Max 10 tabs per day, 28 day supply    phenazopyridine (PYRIDIUM) 100 MG tablet Take 1 tablet (100 mg) by mouth 3 times daily.    polyethylene glycol (GLYCOLAX) 17 GM/SCOOP powder Mix 17 grams in 4-8 oz of liquide and drink by mouth daily as needed (Constipation).    pravastatin (PRAVACHOL) 40 MG tablet Take 1 tablet (40 mg) by mouth every evening.    senna (SENOKOT) 8.6 MG tablet Take 1 tablet (8.6 mg) by mouth daily.    sirolimus (RAPAMUNE) 1 MG tablet Take 2 tablets (2 mg) by mouth every morning.    SYRINGE-NEEDLE, DISP, 3 ML (B-D 3CC LUER-LOK SYR 25GX1") 25G X 1" 3 ML MISC Use as directed to inject testosterone    SYRINGE-NEEDLE, DISP, 3 ML 18G X 1-1/2" 3 ML MISC Use to draw up testosterone    tacrolimus (ENVARSUS XR) 1 MG tablet STOP TAKING since 09/28/21 - remaining on chart for dose adjustments, titratable med.    tacrolimus (ENVARSUS XR) 4 MG tablet Take 1 tablet (4 mg) by mouth every morning.    tamsulosin (FLOMAX) 0.4 MG capsule Take 1 capsule (0.4 mg) by mouth daily.    tamsulosin (FLOMAX) 0.4 MG capsule Take 1 capsule (0.4 mg) by mouth daily.    testosterone cypionate (DEPO-TESTOSTERONE) 200 MG/ML SOLN Inject 1 ml into the muscle every 14 days    traZODone (DESYREL) 50 MG tablet Take 1 tablet (50 mg) by mouth nightly.     Current Facility-Administered Medications   Medication    diphenhydrAMINE (BENADRYL) injection 50 mg    diphenhydrAMINE (BENADRYL) tablet 50 mg     Immunization History   Administered Date(s) Administered    COVID-19 (Moderna) Low Dose Red Cap >= 18 Years 02/27/2020     COVID-19 (Moderna) Red Cap >= 12 Years 04/05/2019, 05/05/2019, 09/03/2019    Hep-A/Hep-B; Twinrix, Adult 03/29/2020    Influenza Vaccine (High Dose) Quadrivalent >=65 Years 11/19/2019    Influenza Vaccine (Unspecified) 09/16/2016    Influenza Vaccine >=6 Months 11/29/2009, 11/29/2010, 01/25/2012, 10/22/2013, 10/05/2017, 10/21/2018    Pneumococcal 13 Vaccine (PREVNAR-13) 11/19/2019    Pneumococcal 23 Vaccine (PNEUMOVAX-23) 11/29/2012, 03/29/2020    Tdap 01/17/2011   Deferred Date(s) Deferred    Pneumococcal 23 Vaccine (PNEUMOVAX-23) 03/20/2019     Physical Exam:  BP 102/69 (BP Location: Right arm, BP Patient Position: Sitting, BP cuff size: Large)   Pulse 98   Temp 98.5 F (36.9 C) (Temporal)   Resp 16   Ht 5' 10" (1.778 m)   Wt 96.2 kg (212 lb)   SpO2 97%   BMI 30.42 kg/m      General Appearance: ***alert, no distress, pleasant affect, cooperative.  Heart:  JVD ***, PMI ***, normal rate and regular rhythm, no murmurs, clicks, or gallops. ***  Lungs: ***clear to auscultation and percussion. No rales, rhonchi, or wheezes noted. No chest deformities noted.  Abdomen: ***BS normal.  Abdomen soft, non-tender.  No masses or organomegaly.  Extremities:  ***no cyanosis, clubbing, or edema. Has 2+ peripheral pulses.        Lab Data:  Lab Results   Component Value Date    BUN 26 (H) 09/28/2021    CREAT 1.98 (H) 09/28/2021    CL 99 09/28/2021    NA 140 09/28/2021    K 4.4 09/28/2021    CA 9.2 09/28/2021    TBILI 0.47 09/28/2021    ALB 4.1 09/28/2021    TP 7.1 09/28/2021    AST 22 09/28/2021    ALK 76 09/28/2021    BICARB 29 09/28/2021    ALT 25 09/28/2021    GLU 126 (H) 09/28/2021     Lab Results   Component Value Date    WBC 7.9 09/28/2021    RBC 5.50 09/28/2021    HGB 15.2 09/28/2021    HCT 46.5 09/28/2021    MCV 84.5 09/28/2021    MCHC 32.7 09/28/2021    RDW 12.3 09/28/2021    PLT 162 09/28/2021    MPV 11.6 09/28/2021     Lab Results   Component Value Date    A1C 5.7 02/25/2021     Lab Results   Component Value Date     TSH 1.63 02/25/2021     Lab Results   Component Value Date    CHOL 105 02/25/2021    HDL 38 02/25/2021    LDLCALC 43 02/25/2021    TRIG 121 02/25/2021     Lab Results   Component Value Date    SIROT 11.5 09/28/2021     Lab Results   Component Value Date    FKTR 6.5 09/28/2021     No results found for: CSATR  Lab Results   Component Value Date    CMVPL Not Detected 12/17/2020     Lab Results   Component Value Date    DSA ABSENT 09/28/2021       Prior Cardiovascular Studies:   Lab Results   Component Value Date    LV Ejection Fraction 59 03/24/2021          Echo 03/24/21  Summary:   1. The left ventricular size is normal. The left ventricular systolic function is normal.   2. No left ventricular hypertrophy.   3. Normal pattern of left ventricular diastolic filling.   4. EF=59%.   5. Compared to prior study EF now 59%, was 69% 04/16/20.     LHC/IVUS 03/22/21  CONCLUSION:                                                                   1. Myocardial bridging with mild systolic compression of the mid segment    of the left anterior descending coronary artery.                              2. No angiographic evidence of coronary artery disease.                      3. Intimal thickness noted in LAD/LM up to 0.5 mm (Stable to slightly       worse compare to 2022).                                                         4. Non significant FFR at apical LAD.                                        5. Left ventricular end diastolic pressure appears normal.        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    Assessment/Plan:  # Hematuria  # Dysuria  # Chronic pain  Assessment: We had a long frank discussion about patient's chronic pain issues and the heart transplant team's role in this. I discussed with him that when I initially agreed to cover his chronic opiate prescription, this was the assumption that he would  have a provider versed in chronic pain after 3-4 months, but we are at 6 months and has unable to find one. Additionally, I had not put him on a pain contract at that time, but he recently used more opiates without asking and I informed him this was not appropriate, but because he had not established guidelines I was not going to stop at this time. However, going forward until he can establish with a pain physician, we will set up a pain contract and he will need to follow through like a usual pain clinic with us with goal of provider in 3-4 months or I may start tapering. I will augment adjuvant agents additionally for now and we can continue to work on this.  Plan:  -pain contract signed  -urine tox monthly  -clinic follow up month  -oxycodone 10 mg tablets PO, 1 tab every 4 hours moderate pain, 2 tabs every 4 hours for severe pain, no more than 10 tablets a day, total 280 per 28 days.   -diclofenac cream for joint pain  -lidocaine patch for back pain  -trial of pyridium  -increase gaba at night  -siro change as below  -cymbalta as below    # End-stage heart failure s/p orthotopic heart transplant  # Chronic Immunosuppression/Immunomodulation  Assessment: While we thought continuing sirolimus would help prevent recurrent scar tissue from prostate procedure, it may be exacerbating factors now with delayed wound healing. Will try mmf for 1 month.  Plan:   - continue envarsus 6 mg daily, goal trough 4-8  - HOLD sirolimus 3 mg daily, goal trough 4-8 for at least 1 month  - start mmf 1000 mg bid for one month to allow healing  - Continue to monitor for renal toxicities, infection risk and malignancy risk  - continue pravastatin 40 mg daily  - continue aspirin 81 mg daily    # Hypertension  Assessment: controlled  Plan:  -continue lisinopril 20 mg daily  -resume hctz  -nifedipine 30 mg daily    # Dyslipidemia  -continue pravastatin 40 mg daily    # Depression  Assessment: improved mood  Plan:  -increase cymbalta to 120  mg daily     RTC in 1 month       Nicholas W Wettersten, MD  Advanced Heart Failure, Mechanical Circulatory Support, Transplant  Pgr: [phone]

## 2014-09-04 NOTE — Telephone Encounter (Signed)
Pt states he has been on lisinopril in the past and he kept a chronic cough.  He wants to try something else. Med was ordered today during clinic visit.

## 2014-09-04 NOTE — Progress Notes (Signed)
Patient ID: Dakota Nicholson, male   DOB: Dec 24, 1985, 29 y.o.   MRN: [id number]   Subjective:   Patient ID: Dakota Nicholson male   DOB: [birth year] 29 y.o.   MRN: [id number]  HPI: [name] is a 29 y.o. male with a PMH of HTN, Obesity, GERD, OSA here today for routine clinic visit for his chronic medical problems.  For details of today's visit please refer to problem based charting.  Past Medical History  Diagnosis Date  . Allergy   . Hypertension   . GERD (gastroesophageal reflux disease)    Current Outpatient Prescriptions  Medication Sig Dispense Refill  . esomeprazole (NEXIUM) 40 MG capsule Take 1 capsule (40 mg total) by mouth daily before breakfast. 30 capsule 3  . lisinopril (PRINIVIL,ZESTRIL) 20 MG tablet Take 1 tablet (20 mg total) by mouth daily. 30 tablet 11   No current facility-administered medications for this visit.   Family History  Problem Relation Age of Onset  . Diabetes Mother   . Hypertension Mother   . Asthma Brother    Social History   Social History  . Marital Status: Single    Spouse Name: N/A  . Number of Children: N/A  . Years of Education: N/A   Social History Main Topics  . Smoking status: Current Some Day Smoker -- 0.10 packs/day    Types: Cigarettes    Last Attempt to Quit: 08/21/2008  . Smokeless tobacco: Not on file     Comment: 1-4 cigs a week  . Alcohol Use: 0.0 oz/week    0 Standard drinks or equivalent per week  . Drug Use: Not on file  . Sexual Activity: Not on file   Other Topics Concern  . Not on file   Social History Narrative   [facility] [employer] [employer]    Voice Major    No smoking   Occasional ETOH   Review of Systems: Review of Systems  Constitutional: Negative for fever and chills.  Eyes: Negative for blurred vision and double vision.  Respiratory: Negative for shortness of breath.   Cardiovascular: Negative for chest pain and palpitations.  Gastrointestinal: Negative for nausea,  vomiting, abdominal pain, diarrhea and constipation.  Neurological: Positive for headaches. Negative for dizziness.   Objective:  Physical Exam: Filed Vitals:   09/04/14 1344  BP: 147/85  Pulse: 91  Temp: 98.4 F (36.9 C)  TempSrc: Oral  Height:  (1.88 m)  Weight: 386 lb 6.4 oz (175.27 kg)  SpO2: 100%   GENERAL- alert, co-operative, morbidly obese, not in any distress. HEENT- Atraumatic, normocephalic, PERRL, EOMI, oral mucosa appears moist, good and intact dentition. No carotid bruit, no cervical LN enlargement, thyroid does not appear enlarged, neck supple. CARDIAC- RRR, no murmurs, rubs or gallops. RESP- Moving equal volumes of air, and clear to auscultation bilaterally, no wheezes or crackles. ABDOMEN- Soft, nontender, no guarding or rebound, bowel sounds present. BACK- Normal curvature of the spine, No tenderness along the vertebrae, no CVA tenderness. NEURO- No obvious Cr N abnormality EXTREMITIES- pulse 2+, symmetric, no pedal edema. SKIN- Warm, dry, No rash or lesion. PSYCH- Normal mood and affect, appropriate thought content and speech.  Assessment & Plan:   Case discussed with Dr. Heide Spark. Please refer to Problem based carting for further details of today's visit.

## 2014-09-04 NOTE — Telephone Encounter (Signed)
Patient called in reference to lisinopril causing him to cough.  Patient would like to change meds

## 2014-09-05 ENCOUNTER — Encounter: Payer: Self-pay | Admitting: Internal Medicine

## 2014-09-05 DIAGNOSIS — M25531 Pain in right wrist: Secondary | ICD-10-CM | POA: Insufficient documentation

## 2014-09-05 NOTE — Assessment & Plan Note (Signed)
Reports he has not had any Adderall in several months and is doing well without it. Reports he has seen improvements in his concentration and focus although occasionally has some difficulties with it. Does not appear to need any medications at this time. If symptoms get worse will readdress need for medications.

## 2014-09-05 NOTE — Assessment & Plan Note (Signed)
Continues to put on weight, 32 lb increase in the last year. He reports that stresses in his life over the last few months have contributed to his weight gain. His family has been down to one car until this week and he has been having to drive everyone in his family around at all hours of the day. Says this has interfered with his sleep schedule and he has felt tired all the time. They have since gotten a second car and he report his sleep and energy have improved. He reports that diet has never been an issue it is exercising that he has always struggled with in the past. His main barrier is motivation.   - Counseled about diet and exercise. Could benefit from meeting with Norm Parcel in the future. Encouraged to cut down on carbohydrates and completely stop the soft drinks. Discussed simple things to help motivate exercising.  - No concern for depression contributing to weight gain today

## 2014-09-05 NOTE — Assessment & Plan Note (Signed)
  Assessment: Progress toward smoking cessation:    Barriers to progress toward smoking cessation:    Comments: Reports he started smoking again. Says he smokes maybe 5 cigarettes/week and mostly when he drinks ETOH. Reports he quit smoking completley 2 weeks ago. Last smoked marijuana 3 weeks ago. Concerned about it showing up on drug screens, he is in the processes of trying to find a new job.   Plan: Instruction/counseling given:  I commended patient for quitting and reviewed strategies for preventing relapses. Educational resources provided:    Self management tools provided:    Medications to assist with smoking cessation:  None Patient agreed to the following self-care plans for smoking cessation:    Other plans: does not want NRT at this time

## 2014-09-05 NOTE — Assessment & Plan Note (Signed)
Lab Results  Component Value Date   HGBA1C 5.8 09/04/2014   HGBA1C 5.7* 06/12/2013    Patient with a history of elevated blood sugars. Checked A1c today, 5.8. Concern for developing DM 2/2 morbid obesity and family history. Will continue to monitor for DM in the future. Denies any polyuria or polydipsia today. Counseled about diet and exercise today.

## 2014-09-05 NOTE — Assessment & Plan Note (Signed)
Patient reportss right wrist pain today. The pain began 3 days ago, worse with flexion of his wrist. Does not recall any trauma to the area. The pain has almost completely resolved today. Endorses only minimal tenderness with wrist flexion. Negative Phalen maneuver and Tinel test. Likely 2/2 sprain. Will recommend conservative treatment. Rest, ice, ibuprofen as needed for pain. RTC if symptoms do not improve or get worse.

## 2014-09-05 NOTE — Assessment & Plan Note (Signed)
Symptoms well controlled on Nexium. Reports he purchases it OTC and does not need a prescription.

## 2014-09-05 NOTE — Assessment & Plan Note (Signed)
BP Readings from Last 3 Encounters:  09/04/14 147/85  11/26/13 148/76  07/02/13 144/90    Lab Results  Component Value Date   NA 139 11/26/2013   K 4.2 11/26/2013   CREATININE 1.02 11/26/2013    Assessment: Blood pressure control:  mildly elevated Progress toward BP goal:   no change Comments: Patient reports he has not been taking his medications for the last month. He was prescribed Lisinopril 40 mg daily and HCTZ 25 mg daily in November 2015. Had 3 month supply given at that visit which should have run out in January. Reports concern about being on a diuretic as his job keeps him outside during the day. Also reports a history of a cough with ACE-I in the past.  Plan: Medications:  Losartan 25 mg daily Educational resources provided:   Self management tools provided:   Other plans: RTC in 1 month for BP follow up and to check BMP

## 2014-09-07 NOTE — Progress Notes (Signed)
Internal Medicine Clinic Attending  I saw and evaluated the patient.  I personally confirmed the key portions of the history and exam documented by Dr. Boswell and I reviewed pertinent patient test results.  The assessment, diagnosis, and plan were formulated together and I agree with the documentation in the resident's note. 

## 2014-10-09 ENCOUNTER — Ambulatory Visit: Payer: Self-pay | Admitting: Internal Medicine

## 2014-10-09 ENCOUNTER — Encounter: Payer: Self-pay | Admitting: Internal Medicine

## 2015-03-30 ENCOUNTER — Telehealth: Payer: Self-pay | Admitting: Internal Medicine

## 2015-03-30 NOTE — Telephone Encounter (Signed)
APPT. REMINDER CALL, LMTCB °

## 2015-03-31 ENCOUNTER — Ambulatory Visit (INDEPENDENT_AMBULATORY_CARE_PROVIDER_SITE_OTHER): Payer: Self-pay | Admitting: Internal Medicine

## 2015-03-31 ENCOUNTER — Encounter: Payer: Self-pay | Admitting: Internal Medicine

## 2015-03-31 VITALS — BP 145/65 | HR 62 | Temp 98.2°F | Ht 74.0 in | Wt 364.0 lb

## 2015-03-31 DIAGNOSIS — F17210 Nicotine dependence, cigarettes, uncomplicated: Secondary | ICD-10-CM

## 2015-03-31 DIAGNOSIS — Z720 Tobacco use: Secondary | ICD-10-CM

## 2015-03-31 DIAGNOSIS — E669 Obesity, unspecified: Secondary | ICD-10-CM | POA: Insufficient documentation

## 2015-03-31 DIAGNOSIS — I10 Essential (primary) hypertension: Secondary | ICD-10-CM

## 2015-03-31 DIAGNOSIS — R945 Abnormal results of liver function studies: Secondary | ICD-10-CM

## 2015-03-31 DIAGNOSIS — R7989 Other specified abnormal findings of blood chemistry: Secondary | ICD-10-CM

## 2015-03-31 DIAGNOSIS — Z6841 Body Mass Index (BMI) 40.0 and over, adult: Secondary | ICD-10-CM

## 2015-03-31 NOTE — Patient Instructions (Addendum)
Mr. Delford FieldWright,  It was great to meet you today.  Keep up the excellent work on the weight loss. Like we discussed, your blood pressure was a little high today but I'm fine with trying bring it down with weight loss. We can avoid the medications. I also agree with you that a regular exercise plan would rapidly accelerate your weight loss. You can do it.  Take care and we'll see you in 3 months for another check-up and we'll check some basic bloodwork once you get insurance sorted out.  Dr. Earnest ConroyFlores

## 2015-03-31 NOTE — Assessment & Plan Note (Signed)
He's had a persistently mild elevation in his liver enzymes for the last few years. 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 At the next visit, once he gets insurance, I think we should re-check another CMP and order hepatitis B and C serologies to ensure we're thorough.

## 2015-03-31 NOTE — Assessment & Plan Note (Signed)
His pressure was slightly up today at 145/60 on manual check; he had not been taking the Losartan. He doesn't want to take medications but would like to continue trying to lose weight. I took the losartan off his list. We'll see him back in 3 months for another check.

## 2015-03-31 NOTE — Assessment & Plan Note (Signed)
He's lost 20 pounds in the last year with diet alone. He's working on getting on a regular exercise regiment. 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 I congratulated him on his accomplishment and encouraged to keep it up.

## 2015-03-31 NOTE — Assessment & Plan Note (Signed)
He's smoking 2-4 cigarettes daily and would like to quit on his own will, without medications or nicotine replacement.

## 2015-03-31 NOTE — Progress Notes (Signed)
Patient ID: Dakota Nicholson, male   DOB: 06/10/1985, 30 y.o.   MRN: [id number] [facility] Subjective:   Patient ID: Dakota Nicholson male   DOB: 03/04/1985 30 y.o.   MRN: [id number]  HPI: [name] is a 30 y.o. male with essential hypertension, tobacco abuse, and obesity presenting to clinic for follow-up of hypertension, obesity, and tobacco abuse.  Hypertension: He has not been taking losartan and has not been checking his pressures. He'd like to manage his hypertension with diet and exercise alone.  Obesity: He's down from 386 pounds last year to 364 pounds today with dietary changes. He's also been playing tennis and soccer.  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  He continues to smoke 2 cigarettes daily and I have reviewed his medications with him today.  Review of Systems  Constitutional: Positive for weight loss. Negative for fever, chills and malaise/fatigue.  Respiratory: Negative for shortness of breath.   Cardiovascular: Negative for chest pain and leg swelling.  Gastrointestinal: Negative for heartburn and abdominal pain.  Neurological: Negative for headaches.  Psychiatric/Behavioral: Negative for depression. The patient is not nervous/anxious.    Objective:  Physical Exam: Filed Vitals:   03/31/15 1327 03/31/15 1401  BP: 159/65 145/65  Pulse: 62   Temp: 98.2 F (36.8 C)   TempSrc: Oral   Height: 6\' 2"  (1.88 m)   Weight: 364 lb (165.109 kg)   SpO2: 100%    General: nice young guy resting in chair comfortably, appropriately conversational Cardiac: regular rate and rhythm, no rubs, murmurs or gallops Pulm: breathing well, clear to auscultation bilaterally Ext: warm and well perfused, without pedal edema Skin: no rash, hair, or nail changes   Assessment & Plan:  Case discussed with Dr. Rogelia BogaButcher  Essential hypertension His pressure  was slightly up today at 145/60 on manual check; he had not been taking the Losartan. He doesn't want to take medications but would like to continue trying to lose weight. I took the losartan off his list. We'll see him back in 3 months for another check.  Smoking trying to quit He's smoking 2-4 cigarettes daily and would like to quit on his own will, without medications or nicotine replacement.  Elevated LFTs He's had a persistently mild elevation in his liver enzymes for the last few years. 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 At the next visit, once he gets insurance, I think we should re-check another CMP and order hepatitis B and C serologies to ensure we're thorough.  Obesity He's lost 20 pounds in the last year with diet alone. He's working on getting on a regular exercise regiment. 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 I congratulated him on his accomplishment and encouraged to keep it up.   Follow Up: Return in about 3 months (around 07/01/2015).

## 2015-04-02 NOTE — Progress Notes (Signed)
Internal Medicine Clinic Attending  Case discussed with Dr. Flores at the time of the visit.  We reviewed the resident's history and exam and pertinent patient test results.  I agree with the assessment, diagnosis, and plan of care documented in the resident's note. 

## 2015-07-21 ENCOUNTER — Encounter: Payer: Self-pay | Admitting: Internal Medicine

## 2016-04-19 ENCOUNTER — Encounter (INDEPENDENT_AMBULATORY_CARE_PROVIDER_SITE_OTHER): Payer: Self-pay

## 2016-04-19 ENCOUNTER — Encounter: Payer: Self-pay | Admitting: Internal Medicine

## 2016-04-19 ENCOUNTER — Ambulatory Visit (INDEPENDENT_AMBULATORY_CARE_PROVIDER_SITE_OTHER): Payer: Self-pay | Admitting: Internal Medicine

## 2016-04-19 VITALS — BP 154/79 | HR 86 | Temp 98.0°F | Ht 74.0 in | Wt 382.4 lb

## 2016-04-19 DIAGNOSIS — R21 Rash and other nonspecific skin eruption: Secondary | ICD-10-CM

## 2016-04-19 DIAGNOSIS — Z720 Tobacco use: Secondary | ICD-10-CM

## 2016-04-19 DIAGNOSIS — F17210 Nicotine dependence, cigarettes, uncomplicated: Secondary | ICD-10-CM

## 2016-04-19 DIAGNOSIS — R7303 Prediabetes: Secondary | ICD-10-CM | POA: Insufficient documentation

## 2016-04-19 DIAGNOSIS — I10 Essential (primary) hypertension: Secondary | ICD-10-CM

## 2016-04-19 DIAGNOSIS — R945 Abnormal results of liver function studies: Secondary | ICD-10-CM

## 2016-04-19 DIAGNOSIS — L539 Erythematous condition, unspecified: Secondary | ICD-10-CM

## 2016-04-19 DIAGNOSIS — Z79899 Other long term (current) drug therapy: Secondary | ICD-10-CM

## 2016-04-19 DIAGNOSIS — Z6841 Body Mass Index (BMI) 40.0 and over, adult: Secondary | ICD-10-CM

## 2016-04-19 DIAGNOSIS — B369 Superficial mycosis, unspecified: Secondary | ICD-10-CM

## 2016-04-19 DIAGNOSIS — R7989 Other specified abnormal findings of blood chemistry: Secondary | ICD-10-CM

## 2016-04-19 MED ORDER — NYSTATIN 100000 UNIT/GM EX POWD: Freq: Four times a day (QID) | CUTANEOUS | 0 refills | Status: DC

## 2016-04-19 MED ORDER — OLMESARTAN MEDOXOMIL 20 MG PO TABS: 20.0000 mg | ORAL_TABLET | Freq: Every day | ORAL | 2 refills | Status: DC

## 2016-04-19 NOTE — Progress Notes (Signed)
   CC: HTN follow up  HPI:  [name] is a 31 y.o. male with a past medical history listed below here today for follow up of his HTN.   For details of today's visit and the status of his chronic medical issues please refer to the assessment and plan.  Smoking a pack a week. Has a history of elevated blood sugars and A1c checked a year ago showed an A1c of 5.8. He denies any polyuria or polydipsia today. His weight remains an obstacle with his weight at 384 lbs with a BMI of 49. He reports his diet is doing well. Reports eating lots of baked foods and vegetables. Some fruits. Says he does not eat much junk food and mostly drinks water. Does not get any regular exercise.   Past Medical History:  Diagnosis Date  . Allergy   . GERD (gastroesophageal reflux disease)   . Hypertension     Review of Systems:   See HPI  Physical Exam:  Vitals:   04/19/16 1548 04/19/16 1641  BP: (!) 164/83 (!) 154/79  Pulse: 90 86  Temp: 98 F (36.7 C)   TempSrc: Oral   SpO2: 98%   Weight: (!) 382 lb 6.4 oz (173.5 kg)   Height:  (1.88 m)    Physical Exam  Constitutional: He is well-developed, well-nourished, and in no distress.  Cardiovascular: Normal rate, regular rhythm and normal heart sounds.   Pulmonary/Chest: Effort normal and breath sounds normal.  Abdominal: Soft. Bowel sounds are normal.  Skin:  Erythematous rash underneath right breast skin fold  Vitals reviewed.   Assessment & Plan:   See Encounters Tab for problem based charting.  Patient seen with Dr. Heide Spark

## 2016-04-19 NOTE — Patient Instructions (Signed)
Mr. Cueva,  Continue to work on exercise and weight loss. I am sending in a prescription for Olmesartan 20 mg daily for your blood pressure. Stop using the hydrocortisone cream for your rash and I have sent in a Nystatin cream to use instead.  I would like to see you back in [facility] in about 2 weeks for follow up.

## 2016-04-19 NOTE — Assessment & Plan Note (Addendum)
He has had a persistently elevated AST/ALT in the past but lab work has been limited due to lack of insurance. Concern for possible NAFLD given his obesity.   Will get lab work at follow up to limit cost for patient.

## 2016-04-20 DIAGNOSIS — B369 Superficial mycosis, unspecified: Secondary | ICD-10-CM | POA: Insufficient documentation

## 2016-04-20 NOTE — Assessment & Plan Note (Signed)
Weight today is back up 20 lbs from last year at 382 lbs. BMI today is 49. Discussed diet and exercise today. Reports he does not exercise at all and is sedentary most of the time.   Plan: Discussed diet and exercise and strongly encouraged diet and exercise for weight loss today

## 2016-04-20 NOTE — Assessment & Plan Note (Addendum)
BP Readings from Last 3 Encounters:  04/19/16 (!) 154/79  03/31/15 (!) 145/65  09/04/14 (!) 147/85    Lab Results  Component Value Date   NA 139 11/26/2013   K 4.2 11/26/2013   CREATININE 1.02 11/26/2013   [name] has a history of HTN and has been on medications in the past. Reports he did not like being on diuretics in the past due to the frequent urination but was on Benicar in the past with no issues. Appears to have had issues with ACE-I induced coughing previously. Today BP is elevated at 164/83 and improved slightly to 154/79 on re-check. He is very resistant to medications and says he is motivated to lose weight and control the blood pressure that way. Did manage to convince him to re-start a blood pressure medication until his weight is better controlled.   Assessment: HTN  Plan: Start olmesartan 20 mg daily today RTC in 2 weeks for recheck CMET at follow up

## 2016-04-20 NOTE — Assessment & Plan Note (Signed)
Patient with complaints of pruritic rash underneath right breast fold. Has been there for several weeks. Has been using hydrocortisone cream to help with the itching.   Appears erythematous on exam. Limited to skin fold area.   Assessment: Likely fungal infection  Plan: Stop hydrocortisone cream Nystatin power

## 2016-04-20 NOTE — Assessment & Plan Note (Signed)
Continues to smoke. Says he no longer buys cigarettes and only gets them from friends/co-workers. Says he smokes 1-2 a day this way. No interest in help quitting and would like to continue to work on it on his own.   Plan: Continue to encourage cessation

## 2016-04-20 NOTE — Assessment & Plan Note (Addendum)
Lab Results  Component Value Date   HGBA1C 5.8 09/04/2014   HGBA1C 5.7 (H) 06/12/2013    Has a history of elevated blood sugars and A1c checked a year ago showed an A1c of 5.8. He denies any polyuria or polydipsia today. His weight remains an obstacle with his weight at 382 lbs with a BMI of 49. He reports his diet is doing well. Reports eating lots of baked foods and vegetables. Some fruits. Says he does not eat much junk food and mostly drinks water. Does not get any regular exercise.   Assessment: Pre-DM  Plan: Strongly encouraged exercise and weight loss Does not have any insurance - will re-check A1c at follow up visit with rest of labs draw; follow annually.

## 2016-04-21 ENCOUNTER — Telehealth: Payer: Self-pay | Admitting: *Deleted

## 2016-04-21 MED ORDER — LOSARTAN POTASSIUM 50 MG PO TABS: 50.0000 mg | ORAL_TABLET | Freq: Every day | ORAL | 2 refills | Status: DC

## 2016-04-21 MED FILL — LOSARTAN POTASSIUM 50 MG TA: 50 | 30 days supply | Qty: 30 | Fill #0

## 2016-04-21 NOTE — Progress Notes (Signed)
Internal Medicine Clinic Attending  I saw and evaluated the patient.  I personally confirmed the key portions of the history and exam documented by Dr. Boswell and I reviewed pertinent patient test results.  The assessment, diagnosis, and plan were formulated together and I agree with the documentation in the resident's note. 

## 2016-04-21 NOTE — Telephone Encounter (Signed)
Unfortunately no ARBs are listed on [facility]'s $4 list and patient had cough from ACE-I in the past. Losartan is available at the Outpatient pharmacy. Will send in Rx for Losartan 50 mg daily to the outpatient pharmacy. Notified patient.

## 2016-04-21 NOTE — Telephone Encounter (Signed)
Per [facility] pharmacy, Olmesartan costs $175 & too expensive. Patient requests lisinopril  ($4).

## 2016-06-14 ENCOUNTER — Encounter: Payer: Self-pay | Admitting: Internal Medicine

## 2017-05-15 ENCOUNTER — Ambulatory Visit: Payer: Self-pay | Admitting: Internal Medicine

## 2017-05-15 ENCOUNTER — Encounter: Payer: Self-pay | Admitting: Internal Medicine

## 2017-05-15 ENCOUNTER — Other Ambulatory Visit: Payer: Self-pay

## 2017-05-15 VITALS — BP 143/80 | HR 93 | Temp 98.6°F | Ht 74.0 in | Wt 395.2 lb

## 2017-05-15 DIAGNOSIS — Z720 Tobacco use: Secondary | ICD-10-CM | POA: Insufficient documentation

## 2017-05-15 DIAGNOSIS — I10 Essential (primary) hypertension: Secondary | ICD-10-CM

## 2017-05-15 DIAGNOSIS — F17290 Nicotine dependence, other tobacco product, uncomplicated: Secondary | ICD-10-CM

## 2017-05-15 DIAGNOSIS — Z23 Encounter for immunization: Secondary | ICD-10-CM | POA: Insufficient documentation

## 2017-05-15 DIAGNOSIS — Z6841 Body Mass Index (BMI) 40.0 and over, adult: Secondary | ICD-10-CM

## 2017-05-15 DIAGNOSIS — F1290 Cannabis use, unspecified, uncomplicated: Secondary | ICD-10-CM

## 2017-05-15 DIAGNOSIS — K219 Gastro-esophageal reflux disease without esophagitis: Secondary | ICD-10-CM

## 2017-05-15 NOTE — Progress Notes (Signed)
   CC: follow-up of HTN, morbid obesity  HPI:  [name] is a 32 y.o. M with hypertension, GERD, morbid obesity, history of tobacco use who currently "vapes", ongoing marijuana use and history of elevated LFTs who presents today for routine follow-up of these conditions. He has no acute complaints but did want to discuss weight loss.   For details regarding today's visit and the status of their chronic medical issues, please refer to the assessment and plan.  Past Medical History:  Diagnosis Date  . Allergy   . GERD (gastroesophageal reflux disease)   . Hypertension    Review of Systems:   General: Denies fevers, chills, fatigue HEENT: Denies acute changes in vision, sore throat, headache Cardiac: Denies CP, SOB, palpitations Abd: Denies abdominal pain, changes in bowels Extremities: Denies weakness or swelling  Physical Exam: General: Alert, in no acute distress. Pleasant and conversant. Obese. HEENT: No icterus, injection or ptosis. No hoarseness or dysarthria  Cardiac: RRR, no MGR appreciated Pulmonary: CTA BL with normal WOB on RA. Able to speak in complete sentences Abd: Soft, obese abdomen. Non-tender. +bs Extremities: Warm, perfused. No pedal edema.   Vitals:   05/15/17 1525  BP: (!) 143/80  Pulse: 93  Temp: 98.6 F (37 C)  TempSrc: Oral  SpO2: 98%  Weight: (!) 395 lb 3.2 oz (179.3 kg)  Height:  (1.88 m)   Body mass index is 50.74 kg/m.  Assessment & Plan:   See Encounters Tab for problem based charting.  Patient discussed with Dr. Cleda Nicholson

## 2017-05-15 NOTE — Assessment & Plan Note (Signed)
Assessment: BMI increased since last visit, now >50%. He feels that this increased weight might be related to recent muscle gain as his clothes are fitting a little looser. He is aware he needs to lose weight and has made plans with his girlfriend to exercise and eat healthier.   Plan: Encouraged patient to focus on weight loss, specifically his diet and exercise. We discussed risk of diabetes, high cholesterol, etc. Seems motivated to make some changes, but ultimately I worry he may need more aggressive interventions for weight loss.  -RTC 6 months

## 2017-05-15 NOTE — Assessment & Plan Note (Addendum)
Assessment & Plan: Patient able to cut-back on his tobacco use and now "vapes" although does continue to smoke marijuana regularly. No intention of stopping marijuana at this point. Discussed that while I'm pleased he was able to stop smoking cigarettes, but did discuss several health risks related to "vaping."

## 2017-05-15 NOTE — Assessment & Plan Note (Signed)
Assessment: Patient inquired about Tdap today as he will be travelling and spending time outdoors over the next few months. Its been >10 years since his last vaccination.   Plan: Tdap given today in clinic. Tolerated well without immediate complication.

## 2017-05-15 NOTE — Patient Instructions (Signed)
It was great seeing you today! I'm glad you are feeling OK.   Today we talked about several health issues including smoking, weight and diet. Please make a strong-effort to prioritize weight-loss in regards to your health. There are several conditions you are at risk of developing including diabetes, high cholesterol and high blood pressure.   Please return to clinic in 6 months for follow-up. We are happy to see you sooner should any concerns arise!

## 2017-05-15 NOTE — Assessment & Plan Note (Addendum)
Assessment: Hypertensive today at 143/80 with pulse 93. Patient stopped taking Losartan shortly after it being prescribed, citing he not wanting to take medications as the reason. Fortunately he was able to cut-back significantly on his tobacco use and now "vapes."   Plan: Patient would benefit from better control of his blood pressure. We discussed medications today but ultimately patient wanted to try diet and exercise again.

## 2017-05-16 NOTE — Progress Notes (Signed)
Internal Medicine Clinic Attending  Case discussed with Dr. Molt at the time of the visit.  We reviewed the resident's history and exam and pertinent patient test results.  I agree with the assessment, diagnosis, and plan of care documented in the resident's note. 

## 2017-07-15 ENCOUNTER — Encounter: Payer: Self-pay | Admitting: *Deleted

## 2019-12-28 ENCOUNTER — Telehealth: Payer: Self-pay | Admitting: Internal Medicine

## 2019-12-28 MED ORDER — ONDANSETRON HCL 4 MG PO TABS: 4.0000 mg | ORAL_TABLET | Freq: Three times a day (TID) | ORAL | 1 refills | Status: AC | PRN

## 2019-12-28 NOTE — Telephone Encounter (Signed)
Patient called because he has been having nausea and vomiting since 1 PM.  He has some abdominal pain in the middle of his abdomen.  He has had 2 loose bowel movements. He estimates vomiting at least 5 times.  Initially vomiting his gastric contents and now just dry heaving. He feels slightly better after vomiting each time. Denies hematemesis, fever, lightheadedness, chest pain, worsening abdominal pain, shortness of breath or sick contacts.  He has been vaccinated for COVID-19.    Assessment/Plan: Symptoms seem consistent with infectious etiology given acute onset and no other related symptoms. Discussed limits of telephone visit and given precautions to seek in person care if not improving by tomorrow. Will send in some Zofran to help with symptoms . Encourage fluid intake with Pedialyte or Gatorade to prevent dehydration.

## 2022-02-01 ENCOUNTER — Telehealth: Payer: Self-pay | Admitting: Student

## 2022-02-01 NOTE — Telephone Encounter (Signed)
Patient reporting abdominal pain for the last two days. Describes crampy intermittent pain, mainly to the left of navel. Mentions he feels like he overate a few days ago but has not had many bowel movements. He denies any sharp, stabbing pains, nausea, vomiting. He has not tried over the counter medications. We discussed trialing OTC laxatives, including Miralax, to help with constipation. If pain persists I have instructed him to make an in-person appointment with our clinic and we would be happy to see him.  Sanjuan Dame, MD Internal Medicine PGY-3 Pager: [phone]

## 2023-01-30 ENCOUNTER — Ambulatory Visit: Payer: Medicaid Other | Admitting: Internal Medicine

## 2023-01-30 VITALS — BP 193/111 | HR 94 | Temp 98.3°F | Ht 73.0 in | Wt >= 6400 oz

## 2023-01-30 DIAGNOSIS — R7989 Other specified abnormal findings of blood chemistry: Secondary | ICD-10-CM | POA: Diagnosis not present

## 2023-01-30 DIAGNOSIS — Z6841 Body Mass Index (BMI) 40.0 and over, adult: Secondary | ICD-10-CM | POA: Diagnosis not present

## 2023-01-30 DIAGNOSIS — I10 Essential (primary) hypertension: Secondary | ICD-10-CM

## 2023-01-30 DIAGNOSIS — R7303 Prediabetes: Secondary | ICD-10-CM | POA: Diagnosis not present

## 2023-01-30 DIAGNOSIS — G4733 Obstructive sleep apnea (adult) (pediatric): Secondary | ICD-10-CM

## 2023-01-30 DIAGNOSIS — E119 Type 2 diabetes mellitus without complications: Secondary | ICD-10-CM | POA: Insufficient documentation

## 2023-01-30 DIAGNOSIS — I1A0 Resistant hypertension: Secondary | ICD-10-CM | POA: Diagnosis present

## 2023-01-30 DIAGNOSIS — N522 Drug-induced erectile dysfunction: Secondary | ICD-10-CM

## 2023-01-30 DIAGNOSIS — N529 Male erectile dysfunction, unspecified: Secondary | ICD-10-CM | POA: Insufficient documentation

## 2023-01-30 HISTORY — DX: Type 2 diabetes mellitus without complications: E11.9

## 2023-01-30 LAB — POCT GLYCOSYLATED HEMOGLOBIN (HGB A1C): Hemoglobin A1C: 7.5 % — AB (ref 4.0–5.6)

## 2023-01-30 LAB — GLUCOSE, CAPILLARY: Glucose-Capillary: 176 mg/dL — ABNORMAL HIGH (ref 70–99)

## 2023-01-30 MED ORDER — SPIRONOLACTONE 25 MG PO TABS: 25.0000 mg | ORAL_TABLET | Freq: Every day | ORAL | 5 refills | Status: DC

## 2023-01-30 MED ORDER — OLMESARTAN MEDOXOMIL-HCTZ 40-25 MG PO TABS: 1.0000 | ORAL_TABLET | Freq: Every day | ORAL | 5 refills | Status: DC

## 2023-01-30 MED ORDER — METOPROLOL SUCCINATE ER 100 MG PO TB24: 100.0000 mg | ORAL_TABLET | Freq: Every day | ORAL | 5 refills | Status: DC

## 2023-01-30 NOTE — Assessment & Plan Note (Signed)
 Will need repeat testing and mask fitting.  Problem is contributing to resistant HTN.  Financial considerations are key.

## 2023-01-30 NOTE — Assessment & Plan Note (Signed)
 Concern for possible MALD noted several years ago; will determine if prior provider has recently checked his LFTs, otherwise these will be done when he returns for BP recheck.

## 2023-01-30 NOTE — Progress Notes (Signed)
 New patient visit - establish care  [name] is a 38 yo man who has been receiving care for his HTN from NP at [facility]. He is transferring care to the [facility] upon his mother's recommendation (she is a former employee with IM).  Health care concerns: HTN, obesity, insomnia, ED  PMHx: Difficult to control HTN Class 3 obesity OSA  untreated (CPAP intolerance) Anxiety and hx of isolated panic attack; has a therapist at [facility].  Meds: Losartan  100/hydrochlorothiazide  12.5 daily Metoprolol  tartrate 50 mg bid  Allergies/intolerances: ACEI - cough Amlodipine - severe LE edema  Social: Buyer, [employer]  Vocalist in local choral groups.  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  Lives with partner, also a vocalist, and her parents.  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  No physical activity aside from stairs in multilevel home.  Family Hx: Unsure    Overall feeling well.  Occasional posterior HA, unassociated with dizziness or vision change, which he thought might be associated with dehydration from his hydrochlorothiazide .  Drinks fluids, urine usually pale or colorless.  No chest pain or tightness.  Fatigues with going up/down stairs.  No joint pain.  Sleeps poorly - difficulty initiating sleep and awakens during the night.  ED concerns - can initiate but may not be able to sustain erection.  Good relationship with partner. Committed to maintaining health (has quit smoking, greatly reduced marijuana consumption) but is struggling with weight.  Has eliminated all but rare sodas.  Enjoys snacks.  Evening meals are made by partner's father and are usually delicious - difficult to limit portion size.  Milk is a favorite beverage. Tried Weight Watchers when in college but has not engaged in any formal weight loss program.   Objective:  BP (!) 193/111 (BP Location: Right Arm, Patient  Position: Sitting, Cuff Size: Large)   Pulse 94   Temp 98.3 F (36.8 C) (Oral)   Ht 6' 1 (1.854 m)   Wt (!) 426 lb 11.2 oz (193.5 kg)   SpO2 99%   BMI 56.30 kg/m  Nicely dressed and groomed man appears well; obese habitus.  Neck thick with high circumference; no JVD, no carotid bruits.  Heart RRR with sinus rate variation; no murmurs or extra sounds. Lungs CTA.  Easy respirations and no dyspnea during speech.  LE's with 2-3+ symmetric nontender pitting edema.   Problems addressed today:  Resistant hypertension Many years of HTN, uncontrolled. 193/111 today on metoprolol  50 bid and losartan  100/hydrochlorothiazide  12.5 which he took midday yesterday.  He reports adherence to prescriptions. Sometimes forgets evening dose BB.  Tolerating medications; no dizziness.  Continues to have LE edema (clear lungs).   A:  Uncontrolled (resistant) HTN on 3 drugs.  Untreated OSA and severe obesity are likely drivers.   P:  Will change ARB/hydrochlorothiazide  to employ olmesartan  with higher dose hydrochlorothiazide .  This is expected to be insufficient for control.  Add spironolacton 25 mg daily.  RN BP check in about 3 weeks.  After checking on finances will look into repeat sleep study and CPAP fitting.  Weight loss will be key.   Morbid obesity with BMI of 50.0-59.9, adult (HCC) BMI 56.  Inactivity contributing.  Discussed the roles of calorie types and quantities, physical activity, and mental/emotional relationships with food.  Will start with introducing walking - 15-30 min/d x 2-3 days/wk. Be mindful of calorie content of foods.  He  does not feel need to establish behavioral health visits for weight loss motivation.  He is reticent to use GLP1a due to SE concerns, though we discussed benefits and risks. Declined referral to weight loss clinic due to financial/copay concerns.   OSA (obstructive sleep apnea) Will need repeat testing and mask fitting.  Problem is contributing to resistant HTN.   Financial considerations are key.  Prediabetes Due for DM screening; A1C today.   Elevated LFTs Concern for possible MALD noted several years ago; will determine if prior provider has recently checked his LFTs, otherwise these will be done when he returns for BP recheck.  ED (erectile dysfunction) Initiates but often can't sustain erection;  severe HTN or BB suspected as culprits. Spoke briefly about PDEi's. Resume discussion next visit.

## 2023-01-30 NOTE — Patient Instructions (Addendum)
 Tim,  I'm so glad you came in today and I look forward to working with you on your blood pressure and weight loss goals.  Hypertension:   Changing to a new combo pill which allows a higher dose of the hydrochlorothiazide .  Take every morning.    Because your blood pressure is extremely high, I am doubtful that this change alone will be effective.  Unfortunately this means another medicine.  Please start spironolactone  25 mg every morning.  You may start this after you begin the new combo pill, to ensure you are ok with how you feel.  I will change your metoprolol  to 100 mg daily (extended release) so that you just have to take meds once a day.  Don't skip any!  I'd like you to think about walking for exercise.  Go for 15- 30 minutes and try for 2-3 times a week.  We can discuss weight loss medicines at future visits, and also remember that we have an excellent therapist who can meet with you about these goals as well.  I'll have you come in for a nurse only visit in about 3 weeks, after you've been on your meds.  Please take your meds that day.    *If we don't receive your lab test results from Ms. Placey, I'll have you get your labs drawn that same day.  Let's get together in 3 months.  You can do this!  Dr. Trudy

## 2023-01-30 NOTE — Assessment & Plan Note (Signed)
 Initiates but often can't sustain erection;  severe HTN or BB suspected as culprits. Spoke briefly about PDEi's. Resume discussion next visit.

## 2023-01-30 NOTE — Assessment & Plan Note (Signed)
 BMI 56.  Inactivity contributing.  Discussed the roles of calorie types and quantities, physical activity, and mental/emotional relationships with food.  Will start with introducing walking - 15-30 min/d x 2-3 days/wk. Be mindful of calorie content of foods.  He does not feel need to establish behavioral health visits for weight loss motivation.  He is reticent to use GLP1a due to SE concerns, though we discussed benefits and risks. Declined referral to weight loss clinic due to financial/copay concerns.

## 2023-01-30 NOTE — Assessment & Plan Note (Signed)
 Due for DM screening; A1C today.

## 2023-01-30 NOTE — Assessment & Plan Note (Signed)
 Many years of HTN, uncontrolled. 193/111 today on metoprolol  50 bid and losartan  100/hydrochlorothiazide  12.5 which he took midday yesterday.  He reports adherence to prescriptions. Sometimes forgets evening dose BB.  Tolerating medications; no dizziness.  Continues to have LE edema (clear lungs).   A:  Uncontrolled (resistant) HTN on 3 drugs.  Untreated OSA and severe obesity are likely drivers.   P:  Will change ARB/hydrochlorothiazide  to employ olmesartan  with higher dose hydrochlorothiazide .  This is expected to be insufficient for control.  Add spironolacton 25 mg daily.  RN BP check in about 3 weeks.  After checking on finances will look into repeat sleep study and CPAP fitting.  Weight loss will be key.

## 2023-02-01 ENCOUNTER — Other Ambulatory Visit: Payer: Self-pay | Admitting: Internal Medicine

## 2023-02-01 DIAGNOSIS — E119 Type 2 diabetes mellitus without complications: Secondary | ICD-10-CM

## 2023-02-01 DIAGNOSIS — I1A0 Resistant hypertension: Secondary | ICD-10-CM

## 2023-02-02 ENCOUNTER — Other Ambulatory Visit: Payer: Self-pay | Admitting: Internal Medicine

## 2023-02-02 ENCOUNTER — Encounter: Payer: Self-pay | Admitting: Internal Medicine

## 2023-02-02 DIAGNOSIS — E119 Type 2 diabetes mellitus without complications: Secondary | ICD-10-CM

## 2023-02-02 MED ORDER — METFORMIN HCL ER 500 MG PO TB24: 500.0000 mg | ORAL_TABLET | Freq: Every day | ORAL | 2 refills | Status: DC

## 2023-02-02 NOTE — Addendum Note (Signed)
Addended by: Charissa Bash A on: 02/02/2023 04:40 PM   Modules accepted: Orders

## 2023-02-20 ENCOUNTER — Ambulatory Visit: Payer: Medicaid Other | Admitting: *Deleted

## 2023-02-20 DIAGNOSIS — I10 Essential (primary) hypertension: Secondary | ICD-10-CM

## 2023-02-20 DIAGNOSIS — I1A0 Resistant hypertension: Secondary | ICD-10-CM

## 2023-02-20 NOTE — Progress Notes (Unsigned)
    Dakota Nicholson presented today for blood pressure check. Patient {Is/is not:9024} prescribed blood pressure medications and I confirmed that patient {Desc; did/not:14019} take their blood pressure medication prior to today's appointment. Blood pressure was taken in the usual and appropriate manner using an automated BP cuff.     Vitals:   02/20/23 0955  BP: (!) 147/81  Pulse 76    Results of today's visit will be routed to Dr. Georgiana Renne Auer Zameza,Atway,Bender,,Dean,Ghiles,Goodwin,Gomez Caraballo,,Ingold,Juberg,Justus,,Khan,Koomson,,Masters,McLendon,,Nooruddin,Patel,Perez,Tawkaliyar,Youssefzadeh,Zheng,Guilloud,Hoffman,Lau,Machen,Narendra,Mullen,Vincent,Williams} for review and further management.

## 2023-02-21 ENCOUNTER — Telehealth: Payer: Self-pay | Admitting: *Deleted

## 2023-02-21 ENCOUNTER — Telehealth: Payer: Self-pay | Admitting: Internal Medicine

## 2023-02-21 LAB — BMP8+ANION GAP
Anion Gap: 15 mmol/L (ref 10.0–18.0)
BUN/Creatinine Ratio: 11 (ref 9–20)
BUN: 16 mg/dL (ref 6–20)
CO2: 24 mmol/L (ref 20–29)
Calcium: 9.1 mg/dL (ref 8.7–10.2)
Chloride: 99 mmol/L (ref 96–106)
Creatinine, Ser: 1.46 mg/dL — ABNORMAL HIGH (ref 0.76–1.27)
Glucose: 166 mg/dL — ABNORMAL HIGH (ref 70–99)
Potassium: 4.3 mmol/L (ref 3.5–5.2)
Sodium: 138 mmol/L (ref 134–144)
eGFR: 64 mL/min/{1.73_m2} (ref >59)

## 2023-02-21 NOTE — Telephone Encounter (Addendum)
 Patient in for B/P check on yesterday.  Asked about getting a less costly  B/P medication.  Stated had been mentioned at last visit.

## 2023-02-21 NOTE — Telephone Encounter (Signed)
 Reached Dakota Nicholson by phone and reviewed the BP result from yesterday (much improved) and plans going forward.  He is tolerated the medication regimen well.  Unfortunately spironolactone  isn't covered by Medicaid and is not affordable at retail price.  I'll be looking into work arounds (would like to avoid bid drugs; intolerant to ACEI and CCBs). Plan f/u in 04/2023.

## 2023-03-05 ENCOUNTER — Other Ambulatory Visit: Payer: Self-pay

## 2023-03-05 DIAGNOSIS — I10 Essential (primary) hypertension: Secondary | ICD-10-CM

## 2023-03-05 MED ORDER — OLMESARTAN MEDOXOMIL-HCTZ 40-25 MG PO TABS: 1.0000 | ORAL_TABLET | Freq: Every day | ORAL | 5 refills | Status: DC

## 2023-03-05 MED ORDER — METOPROLOL SUCCINATE ER 100 MG PO TB24: 100.0000 mg | ORAL_TABLET | Freq: Every day | ORAL | 5 refills | Status: DC

## 2023-03-05 MED ORDER — SPIRONOLACTONE 25 MG PO TABS: 25.0000 mg | ORAL_TABLET | Freq: Every day | ORAL | 5 refills | Status: DC

## 2023-03-06 NOTE — Telephone Encounter (Signed)
Yes he said that same thing he was not able to afford it  but he is hoping changing the pharmacy   will help ... So I am hoping so as well .Dakota Nicholson

## 2023-03-26 NOTE — Telephone Encounter (Signed)
 B/P was 147/81 P-76.  160/84  rechecks were further elevated.

## 2023-08-23 ENCOUNTER — Encounter: Payer: Self-pay | Admitting: Internal Medicine

## 2023-11-15 ENCOUNTER — Other Ambulatory Visit: Payer: Self-pay

## 2023-11-15 DIAGNOSIS — I10 Essential (primary) hypertension: Secondary | ICD-10-CM

## 2023-11-15 MED ORDER — METOPROLOL SUCCINATE ER 100 MG PO TB24: 100.0000 mg | ORAL_TABLET | Freq: Every day | ORAL | 5 refills | Status: DC

## 2023-11-15 MED ORDER — SPIRONOLACTONE 25 MG PO TABS: 25.0000 mg | ORAL_TABLET | Freq: Every day | ORAL | 5 refills | Status: DC

## 2023-11-15 NOTE — Telephone Encounter (Signed)
 Medication sent to pharmacy

## 2023-11-19 ENCOUNTER — Telehealth: Payer: Self-pay | Admitting: Internal Medicine

## 2023-11-19 DIAGNOSIS — I10 Essential (primary) hypertension: Secondary | ICD-10-CM

## 2023-11-19 MED ORDER — SPIRONOLACTONE 25 MG PO TABS: 25.0000 mg | ORAL_TABLET | Freq: Every day | ORAL | 5 refills | Status: DC

## 2023-11-19 MED ORDER — OLMESARTAN MEDOXOMIL-HCTZ 40-25 MG PO TABS: 1.0000 | ORAL_TABLET | Freq: Every day | ORAL | 5 refills | Status: AC

## 2023-11-19 MED ORDER — METOPROLOL SUCCINATE ER 100 MG PO TB24
100.0000 mg | ORAL_TABLET | Freq: Every day | ORAL | 5 refills | Status: AC
Start: 2023-11-19 — End: 2024-11-18

## 2023-11-19 NOTE — Telephone Encounter (Signed)
 Copied from CRM [id number]. Topic: Clinical - Medication Refill >> Nov 19, 2023 12:23 PM Shamecia H wrote: Medication: metoprolol  succinate (TOPROL  XL) 100 MG 24 hr tablet  olmesartan -hydrochlorothiazide  (BENICAR  HCT) 40-25 MG tablet  spironolactone  (ALDACTONE ) 25 MG tablet   Has the patient contacted their pharmacy? Yes (Agent: If no, request that the patient contact the pharmacy for the refill. If patient does not wish to contact the pharmacy document the reason why and proceed with request.) (Agent: If yes, when and what did the pharmacy advise?)  This is the patient's preferred pharmacy:  [facility] [address], [place] - [address] [place] [address] Phone: [phone] Fax: [phone]  Is this the correct pharmacy for this prescription? Yes If no, delete pharmacy and type the correct one.   Has the prescription been filled recently? Yes  Is the patient out of the medication? Yes  Has the patient been seen for an appointment in the last year OR does the patient have an upcoming appointment? Yes  Can we respond through MyChart? Yes  Agent: Please be advised that Rx refills may take up to 3 business days. We ask that you follow-up with your pharmacy.

## 2023-11-19 NOTE — Telephone Encounter (Signed)
 Patient requesting rx to be sent to alternative pharmacy.   Medication sent to pharmacy.

## 2023-11-28 DIAGNOSIS — Z13220 Encounter for screening for lipoid disorders: Secondary | ICD-10-CM | POA: Insufficient documentation

## 2023-11-28 NOTE — Progress Notes (Addendum)
 Patient name: Dakota Nicholson Date of birth: Oct 07, 1985 Date of visit: 11/29/23  Type of visit: Established Patient Office Visit  Subjective   Chief concern:  Chief Complaint  Patient presents with   Follow-up    Routine office visit with medication refill on bp medication    Dakota Nicholson is a 38 y.o. male with a PMHx of T2DM, Class 3 Obesity, Resistant HTN, untreated OSA, GAD who presents to [facility] clinic for follow up visit of Diabetes and HTN.   Patient Active Problem List   Diagnosis Date Noted   Encounter for lipid screening for cardiovascular disease 11/28/2023   Type 2 diabetes mellitus without complications (HCC) 01/30/2023   ED (erectile dysfunction) 01/30/2023   Elevated LFTs 09/30/2012   OSA (obstructive sleep apnea) 03/05/2008   Morbid obesity with BMI of 50.0-59.9, adult (HCC) 01/23/2008   Resistant hypertension 04/22/2007     No past surgical history on file.  ROS negative unless otherwise indicated in the HPI or Assessment and Plan.  Current Outpatient Medications  Medication Instructions   metoprolol  succinate (TOPROL  XL) 100 mg, Oral, Daily, Take with or immediately following a meal.   olmesartan -hydrochlorothiazide  (BENICAR  HCT) 40-25 MG tablet 1 tablet, Oral, Daily   spironolactone  (ALDACTONE ) 25 mg, Oral, Daily    Social History   Tobacco Use   Smoking status: Some Days    Current packs/day: 0.00    Types: Cigarettes    Last attempt to quit: 08/21/2008    Years since quitting: 15.2   Smokeless tobacco: Never   Tobacco comments:    1-4 cigs a week  Cutting back  Substance Use Topics   Alcohol use: Yes    Alcohol/week: 0.0 standard drinks of alcohol    Comment: Occasionally.   Drug use: Yes    Types: Marijuana    Comment: Quit during [place].      Objective  Today's Vitals   11/29/23 0839 11/29/23 0916  BP: (!) 194/129 (!) 193/131  Pulse: 84   Temp: 98.4 F (36.9 C)   TempSrc: Oral   SpO2: 97%   Weight: (!) 382 lb  (173.3 kg)   Height: 6' 2 (1.88 m)   PainSc: 0-No pain   Body mass index is 49.05 kg/m.   Physical Exam: Constitutional: well-appearing; morbidly obese; no acute distress HENT: normocephalic atraumatic, mucous membranes moist Eyes: conjunctiva non-erythematous Cardiovascular: regular rate and rhythm, no m/r/g Pulmonary/Chest: normal work of breathing on room air, lungs CTAB but exam limited due to body habitus Abdominal: soft, non-tender, non-distended MSK: normal bulk and tone Neurological: alert & oriented x 3, no focal deficit Skin: warm and dry Extremities: BLE without edema or erythema Psych: normal mood and behavior  Last CBC Lab Results  Component Value Date   WBC 9.3 08/22/2010   HGB 14.2 08/22/2010   HCT 43.6 08/22/2010   MCV 81.3 08/22/2010   MCH 26.5 08/22/2010   RDW 14.2 08/22/2010   PLT 269 08/22/2010   Last metabolic panel Lab Results  Component Value Date   GLUCOSE 166 (H) 02/20/2023   NA 138 02/20/2023   K 4.3 02/20/2023   CL 99 02/20/2023   CO2 24 02/20/2023   BUN 16 02/20/2023   CREATININE 1.46 (H) 02/20/2023   EGFR 64 02/20/2023   CALCIUM  9.1 02/20/2023   PROT 7.1 11/26/2013   ALBUMIN 4.3 11/26/2013   BILITOT 0.4 11/26/2013   ALKPHOS 43 11/26/2013   AST 43 (H) 11/26/2013   ALT 66 (H) 11/26/2013  Last lipids Lab Results  Component Value Date   CHOL 156 06/12/2013   HDL 40 06/12/2013   LDLCALC 91 06/12/2013   TRIG 123 06/12/2013   CHOLHDL 3.9 06/12/2013   Last hemoglobin A1c Lab Results  Component Value Date   HGBA1C 6.3 11/29/2023        Assessment & Plan  Type 2 diabetes mellitus without complication, without long-term current use of insulin (HCC) Assessment & Plan: A1c down to 6.3% at this visit.  Last A1c of 7.5% on 01/30/2023.  Currently being controlled by lifestyle changes.  The patient is a little apprehensive about starting antihyperglycemic's due to his father having trouble with his diabetes medications.  The patient is  mainly worried about side effects and cost of these medicines. The patient has lost significant amount of weight since his visit in January, suspect that A1c will continue to improve with weight loss but he would greatly benefit from GLP-1 agonist for diabetes as well as class III obesity and OSA.  Discussed starting tirzepatide with the patient, please re-initiate this discussion at next office visit in 4 weeks.  Plan to check BMP due to rising creatinine February, lipid panel, UACR. - Meeting with Arland at this visit for diabetes/nutrition education - Reinitiate discussion of starting tirzepatide at next office visit - BMP, lipid panel, UACR pending - Continue lifestyle interventions  Orders: -     Lipid panel -     POCT glycosylated hemoglobin (Hb A1C) -     Microalbumin / creatinine urine ratio -     Basic metabolic panel with GFR  Resistant hypertension Assessment & Plan: Initial BP today is 194/129 with recheck of 193/131.  Patient is currently asymptomatic.  No vision changes, chest pain, abdominal pain, headache, nausea/vomiting/diarrhea, leg swelling.  The patient states that he ran out of his medications about a week and a half ago and needs to pick them up.  Current regimen includes Toprol -XL 100 mg daily, olmesartan -hydrochlorothiazide  40-25 mg daily, spironolactone  25 mg daily.  Patient did state that when he checked his blood pressure at home over the summer while taking his medicines, he was at 1 40-150s systolic over 80-90s diastolic. Suspect that patient's resistant hypertension is due to obesity, untreated OSA, but may have additional secondary causes.  Plan to continue current medication regimen, have patient record blood pressure log and bring log with cuff to next visit in 4 weeks.  Will check BMP given elevated creatinine in February as well as aldosterone/renin ratio to rule out hyperaldosteronism. - Continue Toprol -XL 100 mg daily - Continue Benicar   (olmesartan -hydrochlorothiazide ) 40-25 mg daily - Continue spironolactone  25 mg daily - Follow-up visit in 4 weeks for HTN  Orders: -     Aldosterone + renin activity w/ ratio  Asymptomatic hypertensive urgency  OSA (obstructive sleep apnea) Assessment & Plan: Asked the patient about OSA and CPAP.  The patient does not want to pursue further testing of this and understands that his untreated OSA may be contributing to his hypertension and other comorbidities.  Will continue to reinitiate discussion at each office visit regarding CPAP as well as GLP-1-agonist.   Morbid obesity with BMI of 50.0-59.9, adult (HCC) Assessment & Plan: BMI 49.05.  Weight has decreased from 426 lbs to 382 lbs. patient states he has been eating less and been more active.  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  Discussed the importance of weight loss for improving patient's hypertension, diabetes, OSA.  The patient may be open to starting tirzepatide with further research.  Please address at next office visit.   Encounter for lipid screening for cardiovascular disease -     Lipid panel    Return in about 4 weeks (around 12/27/2023) for HTN/T2DM/OSA follow up visit.   Patient case discussed with Dr. Jeanelle, who also saw and evaluated the patient.  Kahlel Peake, MD [facility]  PGY-1 11/29/2023, 10:27 AM

## 2023-11-29 ENCOUNTER — Other Ambulatory Visit: Payer: Self-pay | Admitting: Dietician

## 2023-11-29 ENCOUNTER — Ambulatory Visit

## 2023-11-29 ENCOUNTER — Encounter: Payer: Self-pay | Admitting: Dietician

## 2023-11-29 ENCOUNTER — Ambulatory Visit (INDEPENDENT_AMBULATORY_CARE_PROVIDER_SITE_OTHER): Admitting: Dietician

## 2023-11-29 ENCOUNTER — Other Ambulatory Visit: Payer: Self-pay

## 2023-11-29 VITALS — BP 193/131 | HR 84 | Temp 98.4°F | Ht 74.0 in | Wt 382.0 lb

## 2023-11-29 DIAGNOSIS — Z13220 Encounter for screening for lipoid disorders: Secondary | ICD-10-CM

## 2023-11-29 DIAGNOSIS — I160 Hypertensive urgency: Secondary | ICD-10-CM | POA: Diagnosis not present

## 2023-11-29 DIAGNOSIS — E119 Type 2 diabetes mellitus without complications: Secondary | ICD-10-CM

## 2023-11-29 DIAGNOSIS — G4733 Obstructive sleep apnea (adult) (pediatric): Secondary | ICD-10-CM | POA: Diagnosis not present

## 2023-11-29 DIAGNOSIS — Z6841 Body Mass Index (BMI) 40.0 and over, adult: Secondary | ICD-10-CM

## 2023-11-29 DIAGNOSIS — I1A0 Resistant hypertension: Secondary | ICD-10-CM | POA: Diagnosis not present

## 2023-11-29 DIAGNOSIS — Z1384 Encounter for screening for dental disorders: Secondary | ICD-10-CM

## 2023-11-29 LAB — GLUCOSE, CAPILLARY: Glucose-Capillary: 104 mg/dL — ABNORMAL HIGH (ref 70–99)

## 2023-11-29 LAB — POCT GLYCOSYLATED HEMOGLOBIN (HGB A1C): HbA1c, POC (controlled diabetic range): 6.3 % (ref 0.0–7.0)

## 2023-11-29 NOTE — Patient Instructions (Addendum)
 Thank you, Dakota Nicholson for allowing us  to provide your care today. Today we discussed the following:  - For your diabetes, please continue implementing lifestyle interventions such as eating a less total amount of foods, eating less carbohydrates/sugars/sweets. - For your high blood pressure, please pick up your medications from the pharmacy. We would like for you to take your blood pressure meds and check your blood pressure twice daily with a log. Please bring your blood pressure log to your next visit - We would like to see you in 4 weeks again for a follow up visit for your blood pressure. We can also talk about starting the GLP-1 agonist injectable medications such as tirzepetide. - We are checking some bloodwork and urine studies. I will call you with the results.   I have ordered the following labs for you:  Lab Orders         Lipid panel         Microalbumin / creatinine urine ratio         Aldosterone/Renin         POC Hbg A1C       Referrals ordered today:   Referral Orders  No referral(s) requested today     I have ordered the following medication/changed the following medications:   Stop the following medications: There are no discontinued medications.   Start the following medications: No orders of the defined types were placed in this encounter.    Follow up:  4 weeks   Remember: Please pick up your blood pressure medications.  Should you have any questions or concerns please call the Internal Medicine Clinic at [phone].     Dakota Cheadle, MD and Dakota Bee, MD [facility]

## 2023-11-29 NOTE — Assessment & Plan Note (Addendum)
 A1c down to 6.3% at this visit.  Last A1c of 7.5% on 01/30/2023.  Currently being controlled by lifestyle changes.  The patient is a little apprehensive about starting antihyperglycemic's due to his father having trouble with his diabetes medications.  The patient is mainly worried about side effects and cost of these medicines. The patient has lost significant amount of weight since his visit in January, suspect that A1c will continue to improve with weight loss but he would greatly benefit from GLP-1 agonist for diabetes as well as class III obesity and OSA.  Discussed starting tirzepatide with the patient, please re-initiate this discussion at next office visit in 4 weeks.  Plan to check BMP due to rising creatinine February, lipid panel, UACR. - Meeting with Arland at this visit for diabetes/nutrition education - Reinitiate discussion of starting tirzepatide at next office visit - BMP, lipid panel, UACR pending - Continue lifestyle interventions

## 2023-11-29 NOTE — Addendum Note (Signed)
 Addended byBETHA WAYMOND CART on: 11/29/2023 11:48 AM   Modules accepted: Orders

## 2023-11-29 NOTE — Progress Notes (Signed)
 Diabetes Self-Management Education  Visit Type: First/Initial  Appt. Start Time: 950 Appt. End Time: 1042  11/29/2023  [name], identified by name and date of birth, is a 38 y.o. male with a diagnosis of Diabetes: Type 2.   ASSESSMENT  Dakota Nicholson is referred today by Dr. Dawson Tan. He is a self directed learner. He has made lifestyle changes that helped him to reduce his weight by 40+ pounds. His father has diabetes and he asked to learn about ozempic and Tirzepatide. He verbalized understanding to the information provided using teach back. He is interested in using GLP-1s for OSA as well.  His A1c is improved today. His blood pressure is still elevated. His lipids and microalbumin creatinine ratio are pending. Encouraged him to continue lifestyle changes to encourage further weight loss. Plan to screen for diabetes distress at follow up.  Estimated body mass index is 49.05 kg/m as calculated from the following:   Height as of an earlier encounter on 11/29/23: 6' 2 (1.88 m).   Weight as of an earlier encounter on 11/29/23: 382 lb (173.3 kg). Wt Readings from Last 10 Encounters:  11/29/23 (!) 382 lb (173.3 kg)  01/30/23 (!) 426 lb 11.2 oz (193.5 kg)  05/15/17 (!) 395 lb 3.2 oz (179.3 kg)  04/19/16 (!) 382 lb 6.4 oz (173.5 kg)  03/31/15 (!) 364 lb (165.1 kg)  09/04/14 (!) 386 lb 6.4 oz (175.3 kg)  11/26/13 (!) 377 lb 12.8 oz (171.4 kg)  07/02/13 (!) 354 lb (160.6 kg)  06/18/13 (!) 350 lb 3.2 oz (158.8 kg)  06/12/13 (!) 346 lb 1.6 oz (157 kg)   Lab Results  Component Value Date   HGBA1C 6.3 11/29/2023   HGBA1C 7.5 (A) 01/30/2023   HGBA1C 5.8 09/04/2014   HGBA1C 5.7 (H) 06/12/2013    BP Readings from Last 3 Encounters:  11/29/23 (!) 193/131  02/20/23 (!) 147/81  01/30/23 (!) 193/111     Lipid Panel     Component Value Date/Time   CHOL 156 06/12/2013 1058   TRIG 123 06/12/2013 1058   HDL 40 06/12/2013 1058   CHOLHDL 3.9 06/12/2013 1058   VLDL 25  06/12/2013 1058   LDLCALC 91 06/12/2013 1058     Diabetes Self-Management Education - 11/29/23 1000       Visit Information   Visit Type First/Initial      Initial Visit   Diabetes Type Type 2    Date Diagnosed 2025. january    Are you currently following a meal plan? Yes    Are you taking your medications as prescribed? No   insurance ran out a month ago and so did his medicines. He has worked to reinstate his insurance and has now restarted his medicine. he is not on any medicine for diabetes     Health Coping   How would you rate your overall health? Good      Psychosocial Assessment   Patient Belief/Attitude about Diabetes Motivated to manage diabetes    What is the hardest part about your diabetes right now, causing you the most concern, or is the most worrisome to you about your diabetes?   Other (comment)   thinking that it is not reversible   Self-care barriers Lack of material resources    Self-management support Doctor's office;Family;CDE visits    Patient Concerns Monitoring;Healthy Lifestyle;Weight Control;Glycemic Control    Special Needs None    Preferred Learning Style No preference indicated    Learning Readiness Ready  How often do you need to have someone help you when you read instructions, pamphlets, or other written materials from your doctor or pharmacy? 2 - Rarely    What is the last grade level you completed in school? 16      Pre-Education Assessment   Patient understands the diabetes disease and treatment process. Needs Instruction    Patient understands incorporating nutritional management into lifestyle. Needs Instruction    Patient undertands incorporating physical activity into lifestyle. Comprehends key points    Patient understands using medications safely. Needs Instruction    Patient understands monitoring blood glucose, interpreting and using results Needs Instruction    Patient understands prevention, detection, and treatment of acute  complications. Needs Instruction    Patient understands prevention, detection, and treatment of chronic complications. Needs Instruction    Patient understands how to develop strategies to address psychosocial issues. Comprehends key points    Patient understands how to develop strategies to promote health/change behavior. Comprehends key points      Complications   Last HgB A1C per patient/outside source 6.3 %    How often do you check your blood sugar? 0 times/day (not testing)   offered him a free trail CGM sensor and he says he will think about it   Fasting Blood glucose range (mg/dL) 29-870    Postprandial Blood glucose range (mg/dL) --   unknown   Number of hypoglycemic episodes per month 0    Number of hyperglycemic episodes ( >200mg /dL): Weekly   unknown   Can you tell when your blood sugar is high? Yes    What do you do if your blood sugar is high? deferred to follow up    Have you had a dilated eye exam in the past 12 months? No   would like a referral   Have you had a dental exam in the past 12 months? No   would like a referral   Are you checking your feet? No      Dietary Intake   Breakfast we did not discuss details about his diet today per his request      Activity / Exercise   Activity / Exercise Type Light (walking / raking leaves);Moderate (swimming / aerobic walking);ADL's   he states he is intentionally being more active generally by tkaing more active jobs     Patient Education   Previous Diabetes Education No    Disease Pathophysiology Factors that contribute to the development of diabetes;Definition of diabetes, type 1 and 2, and the diagnosis of diabetes    Healthy Eating Role of diet in the treatment of diabetes and the relationship between the three main macronutrients and blood glucose level;Meal options for control of blood glucose level and chronic complications.    Medications Reviewed patients medication for diabetes, action, purpose, timing of dose and side  effects.    Monitoring Interpreting lab values - A1C, lipid, urine microalbumina.;Daily foot exams;Yearly dilated eye exam;Identified appropriate SMBG and/or A1C goals.    Acute complications Discussed and identified patients' prevention, symptoms, and treatment of hyperglycemia.    Chronic complications Reviewed with patient heart disease, higher risk of, and prevention;Nephropathy, what it is, prevention of, the use of ACE, ARB's and early detection of through urine microalbumia.;Retinopathy and reason for yearly dilated eye exams;Dental care;Assessed and discussed foot care and prevention of foot problems      Individualized Goals (developed by patient)   Reducing Risk do foot checks daily      Outcomes  Expected Outcomes Demonstrated interest in learning. Expect positive outcomes    Future DMSE 4-6 wks    Program Status Not Completed          Individualized Plan for Diabetes Self-Management Training:   Learning Objective:  Patient will have a greater understanding of diabetes self-management. Patient education plan is to attend individual and/or group sessions per assessed needs and concerns.   Plan:   Patient Instructions  Check your feet daily for redness, cuts or dry skin. If you ever find something , you can watch it for a few days to a week to see if it gets better. Call us  if it does not improve or if it gets worse.  Wash feet daily and apply lotion, but not between the toes.  Always wear good fitting and comfortable shoes and socks.   Call anytime with questions or concerns.  Arland [phone] (new number)   Expected Outcomes:  Demonstrated interest in learning. Expect positive outcomes  Education material provided: Diabetes Resources  If problems or questions, patient to contact team via:  Phone and Email  Future DSME appointment: 4-6 wks Arland Hole, RD 11/29/2023 11:19 AM.

## 2023-11-29 NOTE — Assessment & Plan Note (Addendum)
 Initial BP today is 194/129 with recheck of 193/131.  Patient is currently asymptomatic.  No vision changes, chest pain, abdominal pain, headache, nausea/vomiting/diarrhea, leg swelling.  The patient states that he ran out of his medications about a week and a half ago and needs to pick them up.  Current regimen includes Toprol -XL 100 mg daily, olmesartan -hydrochlorothiazide  40-25 mg daily, spironolactone  25 mg daily.  Patient did state that when he checked his blood pressure at home over the summer while taking his medicines, he was at 1 40-150s systolic over 80-90s diastolic. Suspect that patient's resistant hypertension is due to obesity, untreated OSA, but may have additional secondary causes.  Plan to continue current medication regimen, have patient record blood pressure log and bring log with cuff to next visit in 4 weeks.  Will check BMP given elevated creatinine in February as well as aldosterone/renin ratio to rule out hyperaldosteronism. - Continue Toprol -XL 100 mg daily - Continue Benicar  (olmesartan -hydrochlorothiazide ) 40-25 mg daily - Continue spironolactone  25 mg daily - Follow-up visit in 4 weeks for HTN

## 2023-11-29 NOTE — Assessment & Plan Note (Signed)
 BMI 49.05.  Weight has decreased from 426 lbs to 382 lbs. patient states he has been eating less and been more active.  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  Discussed the importance of weight loss for improving patient's hypertension, diabetes, OSA.  The patient may be open to starting tirzepatide with further research.  Please address at next office visit.

## 2023-11-29 NOTE — Progress Notes (Signed)
 Internal Medicine Clinic Attending  I was physically present during the key portions of the resident provided service and participated in the medical decision making of patient's management care. I reviewed pertinent patient test results.  The assessment, diagnosis, and plan were formulated together and I agree with the documentation in the resident's note.  Jeanelle Layman CROME, MD

## 2023-11-29 NOTE — Assessment & Plan Note (Signed)
 Asked the patient about OSA and CPAP.  The patient does not want to pursue further testing of this and understands that his untreated OSA may be contributing to his hypertension and other comorbidities.  Will continue to reinitiate discussion at each office visit regarding CPAP as well as GLP-1-agonist.

## 2023-11-29 NOTE — Patient Instructions (Addendum)
 Check your feet daily for redness, cuts or dry skin. If you ever find something , you can watch it for a few days to a week to see if it gets better. Call us  if it does not improve or if it gets worse.  Wash feet daily and apply lotion, but not between the toes.  Always wear good fitting and comfortable shoes and socks.   Call anytime with questions or concerns.  Arland [phone] (new number)

## 2023-11-29 NOTE — Addendum Note (Signed)
 Addended by: ANTONE DWAYNE SAILOR on: 11/29/2023 10:43 AM   Modules accepted: Orders

## 2023-11-30 ENCOUNTER — Ambulatory Visit: Payer: Self-pay

## 2023-11-30 LAB — MICROALBUMIN / CREATININE URINE RATIO
Creatinine, Urine: 129.6 mg/dL
Microalb/Creat Ratio: 171 mg/g{creat} — ABNORMAL HIGH (ref 0–29)
Microalbumin, Urine: 222.1 ug/mL

## 2023-12-05 LAB — LIPID PANEL
Chol/HDL Ratio: 3.5 ratio (ref 0.0–5.0)
Cholesterol, Total: 149 mg/dL (ref 100–199)
HDL: 42 mg/dL (ref >39)
LDL Chol Calc (NIH): 96 mg/dL (ref 0–99)
Triglycerides: 54 mg/dL (ref 0–149)
VLDL Cholesterol Cal: 11 mg/dL (ref 5–40)

## 2023-12-05 LAB — ALDOSTERONE + RENIN ACTIVITY W/ RATIO
Aldos/Renin Ratio: 22.6 (ref 0.0–30.0)
Aldosterone: 25.5 ng/dL (ref 0.0–30.0)
Renin Activity, Plasma: 1.126 ng/mL/h (ref 0.167–5.380)

## 2023-12-05 LAB — BASIC METABOLIC PANEL WITH GFR
BUN/Creatinine Ratio: 11 (ref 9–20)
BUN: 10 mg/dL (ref 6–20)
CO2: 22 mmol/L (ref 20–29)
Calcium: 9 mg/dL (ref 8.7–10.2)
Chloride: 101 mmol/L (ref 96–106)
Creatinine, Ser: 0.9 mg/dL (ref 0.76–1.27)
Glucose: 106 mg/dL — ABNORMAL HIGH (ref 70–99)
Potassium: 3.9 mmol/L (ref 3.5–5.2)
Sodium: 139 mmol/L (ref 134–144)
eGFR: 114 mL/min/1.73 (ref >59)

## 2023-12-27 ENCOUNTER — Ambulatory Visit: Admitting: Dietician

## 2023-12-27 ENCOUNTER — Telehealth: Payer: Self-pay

## 2023-12-27 ENCOUNTER — Ambulatory Visit

## 2023-12-27 VITALS — BP 134/89 | HR 64 | Temp 98.5°F | Wt 386.2 lb

## 2023-12-27 DIAGNOSIS — E119 Type 2 diabetes mellitus without complications: Secondary | ICD-10-CM

## 2023-12-27 DIAGNOSIS — I1A0 Resistant hypertension: Secondary | ICD-10-CM

## 2023-12-27 DIAGNOSIS — G4733 Obstructive sleep apnea (adult) (pediatric): Secondary | ICD-10-CM | POA: Diagnosis not present

## 2023-12-27 DIAGNOSIS — F17210 Nicotine dependence, cigarettes, uncomplicated: Secondary | ICD-10-CM

## 2023-12-27 DIAGNOSIS — Z79899 Other long term (current) drug therapy: Secondary | ICD-10-CM

## 2023-12-27 DIAGNOSIS — Z7984 Long term (current) use of oral hypoglycemic drugs: Secondary | ICD-10-CM

## 2023-12-27 DIAGNOSIS — Z6841 Body Mass Index (BMI) 40.0 and over, adult: Secondary | ICD-10-CM | POA: Diagnosis not present

## 2023-12-27 MED ORDER — TIRZEPATIDE 2.5 MG/0.5ML ~~LOC~~ SOAJ: 2.5000 mg | SUBCUTANEOUS | 2 refills | Status: AC

## 2023-12-27 MED ORDER — SPIRONOLACTONE 50 MG PO TABS: 50.0000 mg | ORAL_TABLET | Freq: Every day | ORAL | 11 refills | Status: AC

## 2023-12-27 NOTE — Assessment & Plan Note (Signed)
 Patient reports he had a CPAP for OSA but has not been using it for years now because of discomfort at night.  Reports he sleeps on the side and on stomach and it gets very difficult with CPAP on.  Patient does not want to use CPAP.  Reinstated the importance of being on CPAP and discussed with him that tirzepatide can help with weight loss which will also help with his OSA. - Start tirzepatide 2.5 mg weekly -Not using CPAP

## 2023-12-27 NOTE — Telephone Encounter (Signed)
 Dakota Nicholson (Key: [id number]) Rx #: [id number] Mounjaro 2.5MG /0.5ML auto-injectors Form [facility] [place]  Medicaid Electronic PA Form [phone] NCPDP) Created 8 minutes ago Sent to Plan 5 minutes ago Plan Response 5 minutes ago Submit Clinical Questions 2 minutes ago Determination Favorable 1 minute ago Message from Plan PA Case: [id number], Status: Approved, Coverage Starts on: 12/27/2023 12:00:00 AM, Coverage Ends on: 12/26/2024 12:00:00 AM.. Authorization Expiration Date: December 26, 2024.

## 2023-12-27 NOTE — Patient Instructions (Addendum)
 Please follow instructions as discussed in today's plan: - Increase your spironolactone  from 25 to 50 mg: 1 tablet daily daily - Start using tirzepatide 2.5 mg: Once a week weekly - Continue with all your other blood pressure medications as prescribed - Please document your blood pressure every day. - Follow-up in 2 weeks for blood pressure check with nurse - Follow-up in 4 weeks for tirzepatide use checkup  Thank you Rebecka Pion, DO

## 2023-12-27 NOTE — Patient Instructions (Addendum)
 Follow up on referrals you requested:   For diabetes eye exam: 01/01/24 [facility] Address: [address] [place], [place], [place] [address] Phone: [phone]  For dental: Call [facility] for their list of covered dentists Here are a few I found on Google. Call them to bve sure they accept your insuracne.  1- [facility] 2- [facility] [address], [place], [place] [address] Phone: [phone] 3- [facility], Dr. Mabel PARAS. [place], Dr. Elza Renne Raddle.  For weight loss:  1- It is recommended to work up to at least 200 minutes of exercise weekly  This can be a 40 minute walk on 5 days  a week. Or two 20 minute walks on 5 days a week or 60 minutes 2 days a week and 40 minutes on 2 other days a week. You get the picture.... Resistance and muscle building activities help maintain muscle mass - think push ups, planks, squats, toe raises Starting a GLP-1 RA Medication  Keep in the refrigerator most of the time. (Can stay at room temperature for only 21 days)  Rotate injection sites.  Food Portions: Eat smaller portions than usual. Consider using a smaller plate or bowl. Eat more slowly. Stop eating at the first sign of fullness.   Meal Timing: Eat regularly, don't skip meals. Do not rely on hunger as a signal to eat.  Nutrient Quality: Choose and eat healthy foods Eat protein with each meal and snack. Consider eating protein first. Limit high fat,greasy foods. Limit spicy foods.(Individualize)  Decrease Nausea, Vomiting: On injection day, have largest meal before taking the medication. Eat small, frequent low fat meals Drink enough fluids, at leas 64 ounces/day. Best if not carbonated and do not contain caffeine  Constipation: Assure adequate fiber. (25-39 grams/day) Eat a variety of vegetables, fruits, whole grains and beans (chia seeds,flax seeds or psyllium fiber(Metamucil)  if increasing high fiber foods does not work Add  1/4 c all bran or fiber one to yogurt or other cereal or applesauce Increase physical activity   Let's follow up in 4 weeks  Dakota Nicholson [phone] (new number)

## 2023-12-27 NOTE — Assessment & Plan Note (Signed)
 Patient presents today for blood pressure follow-up.  Reports is taking his metoprolol , Benicar , spironolactone  daily and also took it this a.m.  He reports no CP, SOB.  Has noticed cold hands without any pain or discoloration after starting his BP medications last month.  Informed patient that these changes could be due to cold weather.  Patient's home readings are attached to the objective section.  Readings still elevated and ranged from 150s to 160s in the past few days.  BMP checked a month ago did not show any electrolyte normalities or renal dysfunction.  Will increase his spironolactone  from 25 to 50 mg today.  Reports using amlodipine in the past caused swelling in his feet and also caused freckle like marks on his feet.  Aldosterone/renin levels were WNL.  Asked patient to continue taking other blood pressure medications.  Counseled the patient that weight management can also help with BP reduction in the future.  Patient stated understanding.  - Metoprolol  succinate 100 mg daily -Benicar  40- 25 mg daily - Spironolactone  increased from 25 to 50 mg daily - Patient to follow-up in 2 weeks for an RN BP check

## 2023-12-27 NOTE — Telephone Encounter (Signed)
 Prior Authorization for patient (Mounjaro 2.5MG /0.5ML auto-injectors) came through on cover my meds was submitted with last office notes and labs awaiting approval or denial.  [id number]

## 2023-12-27 NOTE — Progress Notes (Signed)
 Internal Medicine Clinic Attending  I was physically present during the key portions of the resident provided service and participated in the medical decision making of patient's management care. I reviewed pertinent patient test results.  The assessment, diagnosis, and plan were formulated together and I agree with the documentation in the resident's note.  Jeanelle Layman CROME, MD

## 2023-12-27 NOTE — Assessment & Plan Note (Signed)
 Patient's A1c had improved to 6.3 during last visit.  His UACR showed moderate elevation.  Patient had discussed SGLT2 initiation during his last visit.  Discussed with him the benefits and side effects on starting SGLT2 inhibitor like Jardiance today.  Patient declined initially due to concerns about insurance coverage, however, later said that he would not like to start SGLT2 inhibitor now as he does not want to be on several pills.  Patient agreed to start taking tirzepatide after a thorough discussion, as discussed below.  Patient reported that he will try to control his diabetes further with lifestyle modifications like weight management through diet and exercise.  He is hesitant to be on several pills.  Reports, Ms. Arland taught him how to inject tirzepatide during his last visit and that he has another appointment with her today. - Start tirzepatide 2.5 mg weekly - Follow-up with Ms. Arland today -Follow-up in 1 month

## 2023-12-27 NOTE — Progress Notes (Signed)
 CC: 1 month follow-up  HPI:  [name] is a 38 y.o. male living with a history stated below and presents today for 1 month follow-up for chronic condition management. Please see problem based assessment and plan for additional details.  Past Medical History:  Diagnosis Date   Allergy    GERD (gastroesophageal reflux disease)    Hypertension    Type 2 diabetes mellitus without complication 01/30/2023    Medications Ordered Prior to Encounter[1]  Family History  Problem Relation Age of Onset   Diabetes Mother    Hypertension Mother    Asthma Brother     Social History   Socioeconomic History   Marital status: Single    Spouse name: Not on file   Number of children: Not on file   Years of education: Not on file   Highest education level: Not on file  Occupational History   Not on file  Tobacco Use   Smoking status: Some Days    Current packs/day: 0.00    Average packs/day: 0.1 packs/day    Types: Cigarettes    Last attempt to quit: 08/21/2008    Years since quitting: 15.3   Smokeless tobacco: Never   Tobacco comments:    1-4 cigs a week  Cutting back  Substance and Sexual Activity   Alcohol use: Yes    Alcohol/week: 0.0 standard drinks of alcohol    Comment: Occasionally.   Drug use: Yes    Types: Marijuana    Comment: Quit during [place].   Sexual activity: Not on file  Other Topics Concern   Not on file  Social History Narrative   [employer] Student    Voice Major    No smoking   Occasional ETOH   Social Drivers of Health   Tobacco Use: High Risk (02/02/2023)   Patient History    Smoking Tobacco Use: Some Days    Smokeless Tobacco Use: Never    Passive Exposure: Not on file  Financial Resource Strain: Medium Risk (11/29/2023)   Overall Financial Resource Strain (CARDIA)    Difficulty of Paying Living Expenses: Somewhat hard  Food Insecurity: No Food Insecurity (11/29/2023)   Epic    Worried About Programme Researcher, Broadcasting/film/video in the  Last Year: Never true    Ran Out of Food in the Last Year: Never true  Transportation Needs: No Transportation Needs (11/29/2023)   Epic    Lack of Transportation (Medical): No    Lack of Transportation (Non-Medical): No  Physical Activity: Insufficiently Active (11/29/2023)   Exercise Vital Sign    Days of Exercise per Week: 4 days    Minutes of Exercise per Session: 30 min  Stress: Stress Concern Present (11/29/2023)   [employer] of Occupational Health - Occupational Stress Questionnaire    Feeling of Stress: To some extent  Social Connections: Socially Integrated (11/29/2023)   Social Connection and Isolation Panel    Frequency of Communication with Friends and Family: More than three times a week    Frequency of Social Gatherings with Friends and Family: Once a week    Attends Religious Services: More than 4 times per year    Active Member of [employer] or Organizations: Yes    Attends Banker Meetings: More than 4 times per year    Marital Status: Living with partner  Intimate Partner Violence: Not At Risk (11/29/2023)   Epic    Fear of Current or Ex-Partner: No    Emotionally Abused: No  Physically Abused: No    Sexually Abused: No  Depression (PHQ2-9): Low Risk (11/29/2023)   Depression (PHQ2-9)    PHQ-2 Score: 0  Alcohol Screen: Low Risk (11/29/2023)   Alcohol Screen    Last Alcohol Screening Score (AUDIT): 1  Housing: Unknown (11/29/2023)   Epic    Unable to Pay for Housing in the Last Year: No    Number of Times Moved in the Last Year: Not on file    Homeless in the Last Year: No  Utilities: Not At Risk (11/29/2023)   Epic    Threatened with loss of utilities: No  Health Literacy: Adequate Health Literacy (11/29/2023)   B1300 Health Literacy    Frequency of need for help with medical instructions: Never    Review of Systems: ROS  Per HPI, assessment, plan There were no vitals filed for this visit.  Physical Exam: Physical  Exam Constitutional:      Appearance: He is obese.  HENT:     Head: Normocephalic.  Cardiovascular:     Rate and Rhythm: Normal rate and regular rhythm.     Comments: +2 RP BL +2 DP BL Mild swelling and faded freckles like spots on feet noted Pulmonary:     Effort: Pulmonary effort is normal.     Breath sounds: Normal breath sounds.  Neurological:     Mental Status: He is alert.      Assessment & Plan:     Patient seen with Dr. Jeanelle  Assessment & Plan Resistant hypertension Patient presents today for blood pressure follow-up.  Reports is taking his metoprolol , Benicar , spironolactone  daily and also took it this a.m.  He reports no CP, SOB.  Has noticed cold hands without any pain or discoloration after starting his BP medications last month.  Informed patient that these changes could be due to cold weather.  Patient's home readings are attached to the objective section.  Readings still elevated and ranged from 150s to 160s in the past few days.  BMP checked a month ago did not show any electrolyte normalities or renal dysfunction.  Will increase his spironolactone  from 25 to 50 mg today.  Reports using amlodipine in the past caused swelling in his feet and also caused freckle like marks on his feet.  Aldosterone/renin levels were WNL.  Asked patient to continue taking other blood pressure medications.  Counseled the patient that weight management can also help with BP reduction in the future.  Patient stated understanding.  - Metoprolol  succinate 100 mg daily -Benicar  40- 25 mg daily - Spironolactone  increased from 25 to 50 mg daily - Patient to follow-up in 2 weeks for an RN BP check  Type 2 diabetes mellitus without complication, without long-term current use of insulin (HCC) Patient's A1c had improved to 6.3 during last visit.  His UACR showed moderate elevation.  Patient had discussed SGLT2 initiation during his last visit.  Discussed with him the benefits and side effects  on starting SGLT2 inhibitor like Jardiance today.  Patient declined initially due to concerns about insurance coverage, however, later said that he would not like to start SGLT2 inhibitor now as he does not want to be on several pills.  Patient agreed to start taking tirzepatide after a thorough discussion, as discussed below.  Patient reported that he will try to control his diabetes further with lifestyle modifications like weight management through diet and exercise.  He is hesitant to be on several pills.  Reports, Ms. Arland taught him how to  inject tirzepatide during his last visit and that he has another appointment with her today. - Start tirzepatide 2.5 mg weekly - Follow-up with Ms. Arland today -Follow-up in 1 month Morbid obesity with BMI of 50.0-59.9, adult (HCC) Patient's weight went from 426 pounds in 1/25 to 382 pounds a month ago.  He reports making dietary changes and increasing activity which helped with his weight loss.  His weight today has increased by 4 pounds and is at 386.  Patient reports that it was because of multiple Thanksgiving dinners.  Had extensive discussion about GLP-1 agonists like tirzepatide that can be beneficial for him to lose weight which will concurrently help with his diabetes as well as OSA.  Went over all the benefits and side effects of the medication.  Patient reports that both his parents are on GLP-1 agonists and that his dad had severe side effects.  Patient was hesitant initially but on further discussion opened up to the idea of using GLP-1 agonists and finally confirmed that he would like to start using them.  Encouraged him to keep up with his lifestyle modifications and diet and exercise to help with the process.  Patient stated understanding. - Start tirzepatide 2.5 mg weekly today OSA (obstructive sleep apnea) Patient reports he had a CPAP for OSA but has not been using it for years now because of discomfort at night.  Reports he sleeps on the side and  on stomach and it gets very difficult with CPAP on.  Patient does not want to use CPAP.  Reinstated the importance of being on CPAP and discussed with him that tirzepatide can help with weight loss which will also help with his OSA. - Start tirzepatide 2.5 mg weekly -Not using CPAP  No orders of the defined types were placed in this encounter.    Rebecka Pion, D.O. [facility], PGY-1 Date 12/27/2023 Time 10:11 AM     [1]  Current Outpatient Medications on File Prior to Visit  Medication Sig Dispense Refill   metoprolol  succinate (TOPROL  XL) 100 MG 24 hr tablet Take 1 tablet (100 mg total) by mouth daily. Take with or immediately following a meal. 30 tablet 5   olmesartan -hydrochlorothiazide  (BENICAR  HCT) 40-25 MG tablet Take 1 tablet by mouth daily. 30 tablet 5   spironolactone  (ALDACTONE ) 25 MG tablet Take 1 tablet (25 mg total) by mouth daily. 30 tablet 5   No current facility-administered medications on file prior to visit.

## 2023-12-27 NOTE — Assessment & Plan Note (Signed)
 Patient's weight went from 426 pounds in 1/25 to 382 pounds a month ago.  He reports making dietary changes and increasing activity which helped with his weight loss.  His weight today has increased by 4 pounds and is at 386.  Patient reports that it was because of multiple Thanksgiving dinners.  Had extensive discussion about GLP-1 agonists like tirzepatide that can be beneficial for him to lose weight which will concurrently help with his diabetes as well as OSA.  Went over all the benefits and side effects of the medication.  Patient reports that both his parents are on GLP-1 agonists and that his dad had severe side effects.  Patient was hesitant initially but on further discussion opened up to the idea of using GLP-1 agonists and finally confirmed that he would like to start using them.  Encouraged him to keep up with his lifestyle modifications and diet and exercise to help with the process.  Patient stated understanding. - Start tirzepatide 2.5 mg weekly today

## 2023-12-27 NOTE — Progress Notes (Signed)
 Diabetes Distress Screening  Consider the degree to which each of these two items may have distressed or bothered you DURING THE PAST MONTH and circle the appropriate number:  Not a problem A slight problem  Feeling overwhelmed by the demands of living with diabetes 1   Feeling that I am often failing with my diabetes routine  2  He stated that the 4 pound weight gain and having splurged a little bit at Thanksgiving is why he said that feeling that he was failing with his diabetes is a slight problem today and over the past month.   Diabetes Self-Management Education  Visit Type: Follow-up (first fu after initial)  Appt. Start Time: 1045 Appt. End Time: 1115  12/27/2023  [name], identified by name and date of birth, is a 38 y.o. male with a diagnosis of Diabetes:  Type 2.   ASSESSMENT  States he sleeps 6-7 hours most night and wakes feeling better than he used to. He was told that he snores, but he is not aware of it and when he awakens, goes right back to sleep.  Starting tirzepatide.   Estimated body mass index is 49.59 kg/m as calculated from the following:   Height as of 11/29/23: 6' 2 (1.88 m).   Weight as of an earlier encounter on 12/27/23: 386 lb 3.2 oz (175.2 kg). Wt Readings from Last 10 Encounters:  12/27/23 (!) 386 lb 3.2 oz (175.2 kg)  11/29/23 (!) 382 lb (173.3 kg)  01/30/23 (!) 426 lb 11.2 oz (193.5 kg)  05/15/17 (!) 395 lb 3.2 oz (179.3 kg)  04/19/16 (!) 382 lb 6.4 oz (173.5 kg)  03/31/15 (!) 364 lb (165.1 kg)  09/04/14 (!) 386 lb 6.4 oz (175.3 kg)  11/26/13 (!) 377 lb 12.8 oz (171.4 kg)  07/02/13 (!) 354 lb (160.6 kg)  06/18/13 (!) 350 lb 3.2 oz (158.8 kg)   BP Readings from Last 3 Encounters:  12/27/23 134/89  11/29/23 (!) 193/131  02/20/23 (!) 147/81      Diabetes Self-Management Education - 12/27/23 1000       Visit Information   Visit Type Follow-up   first fu after initial     Health Coping   How would you rate your overall  health? Good   stated he might rate it excelent-5 if he lost weight     Pre-Education Assessment   Patient undertands incorporating physical activity into lifestyle. Needs Review    Patient understands using medications safely. Needs Review      Complications   Have you had a dilated eye exam in the past 12 months? --   scheduled for 01/01/24   Have you had a dental exam in the past 12 months? --   gave him some names and numbers today   Are you checking your feet? Yes    How many days per week are you checking your feet? 4      Dietary Intake   Breakfast he declined      Patient Education   Previous Diabetes Education Yes    Being Active Other (comment)   activity level needed for sustained weight loss   Medications Reviewed patients medication for diabetes, action, purpose, timing of dose and side effects.;Taught/reviewed insulin/injectables, injection, site rotation, insulin/injectables storage and needle disposal.   showed him  how to use Mounjaro pen   Chronic complications Assessed and discussed foot care and prevention of foot problems;Dental care;Retinopathy and reason for yearly dilated eye exams  Patient Self-Evaluation of Goals - Patient rates self as meeting previously set goals (% of time)   Reducing Risk (treating acute and chronic complications) 50 - 75 % (half of the time)      Post-Education Assessment   Patient understands the diabetes disease and treatment process. Comprehends key points    Patient understands incorporating nutritional management into lifestyle. Needs Review    Patient undertands incorporating physical activity into lifestyle. Comprehends key points    Patient understands using medications safely. Comphrehends key points    Patient understands monitoring blood glucose, interpreting and using results Comprehends key points    Patient understands prevention, detection, and treatment of chronic complications. Comprehends key points      Outcomes    Expected Outcomes Demonstrated interest in learning. Expect positive outcomes    Future DMSE 4-6 wks    Program Status Not Completed      Subsequent Visit   Since your last visit have you continued or begun to take your medications as prescribed? Yes    Since your last visit have you had your blood pressure checked? Yes    Is your most recent blood pressure lower, unchanged, or higher since your last visit? Lower    Since your last visit have you experienced any weight changes? No change    Since your last visit, are you checking your blood glucose at least once a day? No          Individualized Plan for Diabetes Self-Management Training:   Learning Objective:  Patient will have a greater understanding of diabetes self-management. Patient education plan is to attend individual and/or group sessions per assessed needs and concerns.   Plan:   Patient Instructions  Follow up on referrals you requested:   For diabetes eye exam: 01/01/24 [facility] Address: [address] [place], [place], [place] [address] Phone: [phone]  For dental: Call [facility] for their list of covered dentists Here are a few I found on Google. Call them to bve sure they accept your insuracne.  1- [facility] 2- [facility] [address], [place], [place] [address] Phone: [phone] 3- [facility], Dr. Mabel PARAS. [place], Dr. Elza Renne Raddle.  For weight loss:  1- It is recommended to work up to at least 200 minutes of exercise weekly  This can be a 40 minute walk on 5 days  a week. Or two 20 minute walks on 5 days a week or 60 minutes 2 days a week and 40 minutes on 2 other days a week. You get the picture.... Resistance and muscle building activities help maintain muscle mass - think push ups, planks, squats, toe raises Starting a GLP-1 RA Medication  Keep in the refrigerator most of the time. (Can stay at room temperature for only 21 days)  Rotate  injection sites.  Food Portions: Eat smaller portions than usual. Consider using a smaller plate or bowl. Eat more slowly. Stop eating at the first sign of fullness.   Meal Timing: Eat regularly, don't skip meals. Do not rely on hunger as a signal to eat.  Nutrient Quality: Choose and eat healthy foods Eat protein with each meal and snack. Consider eating protein first. Limit high fat,greasy foods. Limit spicy foods.(Individualize)  Decrease Nausea, Vomiting: On injection day, have largest meal before taking the medication. Eat small, frequent low fat meals Drink enough fluids, at leas 64 ounces/day. Best if not carbonated and do not contain caffeine  Constipation:  Assure adequate fiber. (25-39 grams/day) Eat a variety of vegetables, fruits, whole grains and beans (chia seeds,flax seeds or psyllium fiber(Metamucil)  if increasing high fiber foods does not work Add 1/4 c all bran or fiber one to yogurt or other cereal or applesauce Increase physical activity   Let's follow up in 4 weeks  Arland [phone] (new number)   Expected Outcomes:  Demonstrated interest in learning. Expect positive outcomes  Education material provided: Diabetes Resources  If problems or questions, patient to contact team via:  Phone and Email  Future DSME appointment: 4-6 wks Arland Hole, RD 12/27/2023 2:28 PM.

## 2024-01-14 ENCOUNTER — Ambulatory Visit: Payer: Self-pay

## 2024-01-28 ENCOUNTER — Encounter: Payer: Self-pay | Admitting: Internal Medicine

## 2024-01-28 ENCOUNTER — Ambulatory Visit: Payer: Self-pay

## 2024-01-28 ENCOUNTER — Other Ambulatory Visit: Payer: Self-pay

## 2024-01-28 VITALS — BP 135/93 | HR 60 | Temp 98.6°F | Ht 74.0 in | Wt 389.8 lb

## 2024-01-28 DIAGNOSIS — G4733 Obstructive sleep apnea (adult) (pediatric): Secondary | ICD-10-CM

## 2024-01-28 DIAGNOSIS — Z114 Encounter for screening for human immunodeficiency virus [HIV]: Secondary | ICD-10-CM

## 2024-01-28 DIAGNOSIS — Z1159 Encounter for screening for other viral diseases: Secondary | ICD-10-CM

## 2024-01-28 DIAGNOSIS — E119 Type 2 diabetes mellitus without complications: Secondary | ICD-10-CM

## 2024-01-28 DIAGNOSIS — Z6841 Body Mass Index (BMI) 40.0 and over, adult: Secondary | ICD-10-CM

## 2024-01-28 DIAGNOSIS — I1A0 Resistant hypertension: Secondary | ICD-10-CM | POA: Diagnosis present

## 2024-01-28 MED ORDER — MOUNJARO 7.5 MG/0.5ML ~~LOC~~ SOAJ: 7.5000 mg | SUBCUTANEOUS | 0 refills | Status: AC

## 2024-01-28 MED ORDER — MOUNJARO 5 MG/0.5ML ~~LOC~~ SOAJ: 5.0000 mg | SUBCUTANEOUS | 0 refills | Status: AC

## 2024-01-28 MED ORDER — MOUNJARO 10 MG/0.5ML ~~LOC~~ SOAJ: 10.0000 mg | SUBCUTANEOUS | 0 refills | Status: AC

## 2024-01-28 MED ORDER — MOUNJARO 12.5 MG/0.5ML ~~LOC~~ SOAJ: 12.5000 mg | SUBCUTANEOUS | 0 refills | Status: AC

## 2024-01-28 NOTE — Assessment & Plan Note (Signed)
 Patient's weight has increased somewhat from last visit where he was 386 pounds.  He now weighs 389 pounds but he weighed over 426 pounds in January of last year.  He has been taking Mounjaro  2.5 mg weekly.  He does notice some reflux symptoms but no nausea and vomiting.  He reports that he did not tolerate CPAP in the past and declined referral to a sleep specialist to talk about other options at today's visit.  He has been changing what he eats and states that he did not eat as much as he normally eats at Christmas.  He thinks that he will be able to cut back more on what he is eating now that the holidays are over.  He finds the side effects of Mounjaro  tolerable to him and would like to increase the dose of this medication.  His tirzepatide  is for treatment of his OSA, diabetes, morbid obesity, and hypertension associated with OSA.  - Will order Mounjaro  5 mg weekly to start this week, increase by 2.5 mg weekly.  Future prescriptions provided at today's visit for 7.5 mg, 10mg , and 12.5 mg.  The patient was given instructions to call if he is having intolerable side effects and we will decrease him to his previously tolerated dose.  Otherwise he may continue to increase his Mounjaro . -A1c check at this next visit -Urine microalbumin elevated in November of last year, recheck this starting in May -Follow-up in 3 months -Recommend checking LFTs in the future to evaluate for MASLD

## 2024-01-28 NOTE — Progress Notes (Signed)
 Internal Medicine Clinic Attending  Case discussed with the resident at the time of the visit.  We reviewed the resident's history and exam and pertinent patient test results.  I agree with the assessment, diagnosis, and plan of care documented in the resident's note.

## 2024-01-28 NOTE — Patient Instructions (Signed)
 Thank you, [name], for allowing us  to provide your care today. Today we discussed . . .  > Weight loss/Sleep apnea       - We will increase your Mounjaro  to 5 mg at today's visit.  Take this for 4 weeks and then increase to 7.5, then increase to 10 mg weekly for 4 weeks after that as well. > High blood pressure       - Your blood pressure is nearing your goal of less than 130/80.  We will keep you on the same medications for now with the assumption that with weight loss your blood pressure will improve somewhat.  Please follow-up in 3 months > Healthcare maintenance       - I have ordered screening labs for HIV and hepatitis C at today's visit   I have ordered the following labs for you:  HIV and Hepatitis screen   Referrals ordered today:   Referral Orders  No referral(s) requested today      Follow up: 3 months    Remember:  Should you have any questions or concerns please call the internal medicine clinic at [phone].     Dakota Nicholson, [facility]

## 2024-01-28 NOTE — Assessment & Plan Note (Addendum)
 He has a history of hypertension on multiple medications.  He was seen about a month ago and his spironolactone  was increased from 25 to 50 mg daily. current regimen includes Benicar  40-25, metoprolol  100 mg once daily, spironolactone  50 mg daily.  He has been screened for primary hyperaldosteronism which was negative.  He does have untreated sleep apnea which I believe is contributing to his high blood pressure along with his morbid obesity.  He reports that he is adherent to his medications. He denies headaches, chest pain, shortness of breath, peripheral edema, and vision changes. His BP in the office today is 135/93.  His home blood pressures range in the 130s to 140s systolics in the the 80s to 90s diastolics.  The patient has already been able to achieve substantial weight loss and I believe that his blood pressure will continue to improve as he has more weight loss as this will hopefully treat his sleep apnea somewhat as well.  We did talk extensively about how his sleep apnea is contributing to his hypertension and he declined referral back to sleep medicine for this.  We discussed inspire, dental implants, and different nasal masks and he was uninterested in this.   - Metoprolol  succinate 100 mg daily -Benicar  40- 25 mg daily - Sprinolactone 50mg  daily -BMP today to assess kidney function and electrolytes -If BP not improved at next appointment, recommend initiation of hydralazine or referral to advanced hypertension clinic -Follow up in 3 months

## 2024-01-28 NOTE — Progress Notes (Signed)
 "  CC: Routine Follow Up for BP, DMII, and weight loss after last office visit 12/27/2023  HPI:  Dakota Nicholson is a 39 y.o. male with pertinent PMH of HTN, DMII, sleep apnea intolerant of CPAP, obesity who presents for routine follow-up. Please see problem based assessment and plan for further history.  Review of Systems  Eyes:  Negative for blurred vision.  Cardiovascular:  Negative for chest pain, palpitations and leg swelling.  Gastrointestinal:  Positive for heartburn. Negative for abdominal pain, nausea and vomiting.    Medications: Current Outpatient Medications  Medication Instructions   metoprolol  succinate (TOPROL  XL) 100 mg, Oral, Daily, Take with or immediately following a meal.   Mounjaro  5 mg, Subcutaneous, Weekly   [START ON 02/25/2024] Mounjaro  7.5 mg, Subcutaneous, Weekly   [START ON 03/24/2024] Mounjaro  10 mg, Subcutaneous, Weekly   olmesartan -hydrochlorothiazide  (BENICAR  HCT) 40-25 MG tablet 1 tablet, Oral, Daily   spironolactone  (ALDACTONE ) 50 mg, Oral, Daily   tirzepatide  (MOUNJARO ) 2.5 mg, Subcutaneous, Weekly     Physical Exam:  Vitals:   01/28/24 1034 01/28/24 1102  BP: (!) 148/95 (!) 135/93  Pulse: 69 60  Temp: 98.6 F (37 C)   TempSrc: Oral   Weight: (!) 389 lb 12.8 oz (176.8 kg)   Height: 6' 2 (1.88 m)     Physical Exam Constitutional:      General: He is not in acute distress.    Appearance: He is not ill-appearing.  Cardiovascular:     Rate and Rhythm: Normal rate and regular rhythm.     Heart sounds: No murmur heard.    No friction rub. No gallop.  Pulmonary:     Effort: No respiratory distress.     Breath sounds: No wheezing, rhonchi or rales.  Musculoskeletal:     Right lower leg: No edema.     Left lower leg: No edema.  Skin:    Coloration: Skin is not jaundiced.  Neurological:     Mental Status: He is alert.       Assessment & Plan:   Assessment & Plan Resistant hypertension He has a history of hypertension on  multiple medications.  He was seen about a month ago and his spironolactone  was increased from 25 to 50 mg daily. current regimen includes Benicar  40-25, metoprolol  100 mg once daily, spironolactone  50 mg daily.  He has been screened for primary hyperaldosteronism which was negative.  He does have untreated sleep apnea which I believe is contributing to his high blood pressure along with his morbid obesity.  He reports that he is adherent to his medications. He denies headaches, chest pain, shortness of breath, peripheral edema, and vision changes. His BP in the office today is 135/93.  His home blood pressures range in the 130s to 140s systolics in the the 80s to 90s diastolics.  The patient has already been able to achieve substantial weight loss and I believe that his blood pressure will continue to improve as he has more weight loss as this will hopefully treat his sleep apnea somewhat as well.  We did talk extensively about how his sleep apnea is contributing to his hypertension and he declined referral back to sleep medicine for this.  We discussed inspire, dental implants, and different nasal masks and he was uninterested in this.   - Metoprolol  succinate 100 mg daily -Benicar  40- 25 mg daily - Sprinolactone 50mg  daily -BMP today to assess kidney function and electrolytes -If BP not improved at next appointment, recommend initiation  of hydralazine or referral to advanced hypertension clinic -Follow up in 3 months  OSA (obstructive sleep apnea) Type 2 diabetes mellitus without complication, without long-term current use of insulin (HCC) Morbid obesity with BMI of 50.0-59.9, adult (HCC) Patient's weight has increased somewhat from last visit where he was 386 pounds.  He now weighs 389 pounds but he weighed over 426 pounds in January of last year.  He has been taking Mounjaro  2.5 mg weekly.  He does notice some reflux symptoms but no nausea and vomiting.  He reports that he did not tolerate CPAP in  the past and declined referral to a sleep specialist to talk about other options at today's visit.  He has been changing what he eats and states that he did not eat as much as he normally eats at Christmas.  He thinks that he will be able to cut back more on what he is eating now that the holidays are over.  He finds the side effects of Mounjaro  tolerable to him and would like to increase the dose of this medication.  His tirzepatide  is for treatment of his OSA, diabetes, morbid obesity, and hypertension associated with OSA.  - Will order Mounjaro  5 mg weekly to start this week, increase by 2.5 mg weekly.  Future prescriptions provided at today's visit for 7.5 mg, 10mg , and 12.5 mg.  The patient was given instructions to call if he is having intolerable side effects and we will decrease him to his previously tolerated dose.  Otherwise he may continue to increase his Mounjaro . -A1c check at this next visit -Urine microalbumin elevated in November of last year, recheck this starting in May -Follow-up in 3 months -Recommend checking LFTs in the future to evaluate for MASLD Screening for HIV (human immunodeficiency virus) Patient meets criteria for screening with HIV, will order this. Encounter for hepatitis C screening test for low risk patient Patient meets criteria for screening of hep C, will order this.   Orders Placed This Encounter  Procedures   Basic metabolic panel with GFR   Hepatitis C Ab reflex to Quant PCR   HIV antibody (with reflex)    Patient discussed with Dr. Jone Dauphin   Melvenia Morrison, MD [facility] Internal Medicine Resident PGY-1 Clinic Phone: [phone] Please contact the on call pager at [phone] for any urgent or emergent needs.  "

## 2024-01-29 LAB — HCV AB W REFLEX TO QUANT PCR: HCV Ab: NONREACTIVE

## 2024-01-29 LAB — BASIC METABOLIC PANEL WITH GFR
BUN/Creatinine Ratio: 12 (ref 9–20)
BUN: 16 mg/dL (ref 6–20)
CO2: 24 mmol/L (ref 20–29)
Calcium: 9.2 mg/dL (ref 8.7–10.2)
Chloride: 101 mmol/L (ref 96–106)
Creatinine, Ser: 1.37 mg/dL — ABNORMAL HIGH (ref 0.76–1.27)
Glucose: 126 mg/dL — ABNORMAL HIGH (ref 70–99)
Potassium: 4.6 mmol/L (ref 3.5–5.2)
Sodium: 139 mmol/L (ref 134–144)
eGFR: 69 mL/min/1.73

## 2024-01-29 LAB — HIV ANTIBODY (ROUTINE TESTING W REFLEX): HIV Screen 4th Generation wRfx: NONREACTIVE

## 2024-01-29 LAB — HCV INTERPRETATION

## 2024-02-01 ENCOUNTER — Ambulatory Visit: Payer: Self-pay

## 2024-05-01 ENCOUNTER — Ambulatory Visit: Payer: Self-pay | Admitting: Internal Medicine
